# Patient Record
Sex: Female | Born: 1985 | Race: White | Hispanic: Yes | Marital: Married | Smoking: Never smoker
Health system: Southern US, Community
[De-identification: ages and names within clinical notes are randomized; demographics above are authoritative.]

## PROBLEM LIST (undated history)

## (undated) DIAGNOSIS — R7303 Prediabetes: Secondary | ICD-10-CM

## (undated) DIAGNOSIS — Z862 Personal history of diseases of the blood and blood-forming organs and certain disorders involving the immune mechanism: Secondary | ICD-10-CM

## (undated) DIAGNOSIS — J4520 Mild intermittent asthma, uncomplicated: Secondary | ICD-10-CM

## (undated) DIAGNOSIS — Z8619 Personal history of other infectious and parasitic diseases: Secondary | ICD-10-CM

## (undated) DIAGNOSIS — D251 Intramural leiomyoma of uterus: Secondary | ICD-10-CM

## (undated) DIAGNOSIS — K219 Gastro-esophageal reflux disease without esophagitis: Secondary | ICD-10-CM

## (undated) DIAGNOSIS — N945 Secondary dysmenorrhea: Secondary | ICD-10-CM

## (undated) DIAGNOSIS — J45909 Unspecified asthma, uncomplicated: Secondary | ICD-10-CM

## (undated) DIAGNOSIS — B159 Hepatitis A without hepatic coma: Secondary | ICD-10-CM

## (undated) DIAGNOSIS — D649 Anemia, unspecified: Secondary | ICD-10-CM

## (undated) HISTORY — DX: Hepatitis a without hepatic coma: B15.9

## (undated) HISTORY — DX: Gastro-esophageal reflux disease without esophagitis: K21.9

---

## 2005-06-07 HISTORY — PX: OTHER SURGICAL HISTORY: SHX169

## 2005-06-07 HISTORY — PX: SOFT TISSUE MASS EXCISION: SHX2419

## 2012-06-28 ENCOUNTER — Encounter (HOSPITAL_COMMUNITY): Payer: Self-pay | Admitting: *Deleted

## 2012-07-11 ENCOUNTER — Encounter (HOSPITAL_COMMUNITY): Payer: Self-pay

## 2012-07-11 ENCOUNTER — Other Ambulatory Visit: Payer: Self-pay | Admitting: Obstetrics and Gynecology

## 2012-07-11 ENCOUNTER — Ambulatory Visit (HOSPITAL_COMMUNITY)
Admission: RE | Admit: 2012-07-11 | Discharge: 2012-07-11 | Disposition: A | Discharge status: Home / Self Care | Payer: Self-pay | Source: Ambulatory Visit | Attending: Obstetrics and Gynecology | Admitting: Obstetrics and Gynecology

## 2012-07-11 VITALS — BP 100/76 | Temp 98.6°F | Ht 65.0 in | Wt 121.4 lb

## 2012-07-11 DIAGNOSIS — N63 Unspecified lump in unspecified breast: Secondary | ICD-10-CM

## 2012-07-11 DIAGNOSIS — N6314 Unspecified lump in the right breast, lower inner quadrant: Secondary | ICD-10-CM | POA: Insufficient documentation

## 2012-07-11 DIAGNOSIS — N6323 Unspecified lump in the left breast, lower outer quadrant: Secondary | ICD-10-CM

## 2012-07-11 DIAGNOSIS — Z1239 Encounter for other screening for malignant neoplasm of breast: Secondary | ICD-10-CM

## 2012-07-11 HISTORY — DX: Unspecified asthma, uncomplicated: J45.909

## 2012-07-11 HISTORY — DX: Anemia, unspecified: D64.9

## 2012-07-11 NOTE — Patient Instructions (Signed)
Taught patient how to perform BSE. Patient did not need a Pap smear today due to last Pap smear was July 10, 2012. Let her know BCCCP will cover Pap smears every 3 years unless has a history of abnormal Pap smears. Referred patient to the [facility] for bilateral breast ultrasound. Appointment scheduled for Monday, July 17, 2012 at 1500. Patient aware of appointment and will be there. Patient verbalized understanding.

## 2012-07-11 NOTE — Progress Notes (Signed)
Complaints of lumps in both breast and right breast pain that she rates at a 7-8 out of 10.  Pap Smear:    Pap smear not completed today. Last Pap smear was 07/10/2012 at the free cervical cancer screening at the [facility] and awaiting Pap smear results. Per patient has no history of abnormal Pap smears. No Pap smear results in EPIC.  Physical exam: Breasts Breasts symmetrical. No skin abnormalities bilateral breasts. No nipple retraction bilateral breasts. No nipple discharge bilateral breasts. No lymphadenopathy. Palpated mobile lump within the left breast at 4 o'clock 1 cm from the areola. Palpated mobile lump within the right breast 5 o'clock 1 cm from the areola. Complaints of tenderness when palpated lump in right breast. Referred patient to the [facility] for bilateral breast ultrasound. Appointment scheduled for Monday, July 17, 2012 at 1500.         Pelvic/Bimanual No Pap smear completed today since last Pap smear was 07/10/2012. Pap smear not indicated per BCCCP guidelines.

## 2012-07-17 ENCOUNTER — Ambulatory Visit
Admission: RE | Admit: 2012-07-17 | Discharge: 2012-07-17 | Disposition: A | Discharge status: Home / Self Care | Payer: No Typology Code available for payment source | Source: Ambulatory Visit | Attending: Obstetrics and Gynecology | Admitting: Obstetrics and Gynecology

## 2012-07-17 DIAGNOSIS — N63 Unspecified lump in unspecified breast: Secondary | ICD-10-CM

## 2012-07-31 ENCOUNTER — Telehealth (HOSPITAL_COMMUNITY): Payer: Self-pay | Admitting: *Deleted

## 2012-07-31 NOTE — Telephone Encounter (Signed)
Telephoned patient at home # with interpreter Delorise Royals. Advised patient of negative ultrasound results and would need screening mammogram at age 27. Patient voiced understanding.

## 2013-12-12 ENCOUNTER — Encounter: Payer: Self-pay | Admitting: Family Medicine

## 2013-12-13 ENCOUNTER — Encounter: Payer: Self-pay | Admitting: *Deleted

## 2013-12-17 ENCOUNTER — Encounter: Payer: Self-pay | Admitting: Family Medicine

## 2014-04-08 ENCOUNTER — Encounter (HOSPITAL_COMMUNITY): Payer: Self-pay

## 2014-06-07 DIAGNOSIS — Z8742 Personal history of other diseases of the female genital tract: Secondary | ICD-10-CM

## 2014-06-07 HISTORY — DX: Personal history of other diseases of the female genital tract: Z87.42

## 2015-04-30 ENCOUNTER — Ambulatory Visit (INDEPENDENT_AMBULATORY_CARE_PROVIDER_SITE_OTHER): Payer: BLUE CROSS/BLUE SHIELD | Admitting: Family Medicine

## 2015-04-30 ENCOUNTER — Ambulatory Visit (INDEPENDENT_AMBULATORY_CARE_PROVIDER_SITE_OTHER): Payer: BLUE CROSS/BLUE SHIELD

## 2015-04-30 VITALS — BP 110/76 | HR 82 | Temp 98.7°F | Resp 20 | Ht 65.0 in | Wt 128.4 lb

## 2015-04-30 DIAGNOSIS — J0100 Acute maxillary sinusitis, unspecified: Secondary | ICD-10-CM

## 2015-04-30 DIAGNOSIS — R062 Wheezing: Secondary | ICD-10-CM | POA: Diagnosis not present

## 2015-04-30 DIAGNOSIS — J208 Acute bronchitis due to other specified organisms: Secondary | ICD-10-CM | POA: Diagnosis not present

## 2015-04-30 DIAGNOSIS — R059 Cough, unspecified: Secondary | ICD-10-CM

## 2015-04-30 DIAGNOSIS — R05 Cough: Secondary | ICD-10-CM

## 2015-04-30 DIAGNOSIS — J45901 Unspecified asthma with (acute) exacerbation: Secondary | ICD-10-CM

## 2015-04-30 MED ORDER — ALBUTEROL SULFATE HFA 108 (90 BASE) MCG/ACT IN AERS: 2.0000 | INHALATION_SPRAY | Freq: Four times a day (QID) | RESPIRATORY_TRACT | Status: DC | PRN

## 2015-04-30 MED ORDER — METHYLPREDNISOLONE 4 MG PO TBPK: ORAL_TABLET | ORAL | Status: DC

## 2015-04-30 MED ORDER — IPRATROPIUM BROMIDE 0.02 % IN SOLN
0.5000 mg | Freq: Once | RESPIRATORY_TRACT | Status: AC
  Administered 2015-04-30: 0.5 mg via RESPIRATORY_TRACT

## 2015-04-30 MED ORDER — ALBUTEROL SULFATE (2.5 MG/3ML) 0.083% IN NEBU
2.5000 mg | INHALATION_SOLUTION | Freq: Once | RESPIRATORY_TRACT | Status: AC
  Administered 2015-04-30: 2.5 mg via RESPIRATORY_TRACT

## 2015-04-30 MED ORDER — HYDROCODONE-HOMATROPINE 5-1.5 MG/5ML PO SYRP: 5.0000 mL | ORAL_SOLUTION | Freq: Three times a day (TID) | ORAL | Status: DC | PRN

## 2015-04-30 MED ORDER — AZITHROMYCIN 250 MG PO TABS: ORAL_TABLET | ORAL | Status: DC

## 2015-04-30 NOTE — Progress Notes (Signed)
Chief Complaint:  Chief Complaint  Patient presents with  . Nasal Congestion    started monday   . Cough  . Fever    HPI: Amy Gaines is a 29 y.o. female who [facility] complaining of chest congestion and chest pain since Monday, last week strted having allergies and itchy eyes and then Monday she felt congestedf. She has no wheezing that she can hear durign the day may be some at night or SOB. She has pleuritic CP . She has chest pain when she breathes through her mouth. She ahs tried robitussion, theraflu, wihtout releif. She has a hx of asthma. She does not have an albuterol inhaler. She is a nonsmoker.   Past Medical History  Diagnosis Date  . Anemia   . Asthma    Past Surgical History  Procedure Laterality Date  . Lump removed from left axilla  2007   Social History   Social History  . Marital Status: Single    Spouse Name: N/A  . Number of Children: N/A  . Years of Education: N/A   Social History Main Topics  . Smoking status: Never Smoker   . Smokeless tobacco: Never Used  . Alcohol Use: No  . Drug Use: No  . Sexual Activity: Yes    Birth Control/ Protection: Condom   Other Topics Concern  . None   Social History Narrative   Family History  Problem Relation Age of Onset  . Diabetes Mother   . Heart disease Mother   . Hypertension Mother   . Cancer Maternal Grandmother     cervical  . Diabetes Maternal Grandmother   . Heart disease Maternal Grandmother    No Known Allergies Prior to Admission medications   Not on File     ROS: The patient denies fevers, chills, night sweats, unintentional weight loss,  palpitations,  dyspnea on exertion, nausea, vomiting, abdominal pain, dysuria, hematuria, melena, numbness, weakness, or tingling.   All other systems have been reviewed and were otherwise negative with the exception of those mentioned in the HPI and as above.    PHYSICAL EXAM: Filed Vitals:   04/30/15 1359  BP: 110/76   Pulse: 82  Temp: 98.7 F (37.1 C)  Resp: 20   Body mass index is 21.37 kg/(m^2).   General: Alert, no acute distress HEENT:  Normocephalic, atraumatic, oropharynx patent. EOMI, PERRLA Erythematous throat, no exudates, TM normal, + sinus tenderness, + erythematous/boggy nasal mucosa Cardiovascular:  Regular rate and rhythm, no rubs murmurs or gallops.  No Carotid bruits, radial pulse intact. No pedal edema.  Respiratory: Clear to auscultation bilaterally.  + wheezes, No rales, or rhonchi.  No cyanosis, no use of accessory musculature Abdominal: No organomegaly, abdomen is soft and non-tender, positive bowel sounds. No masses. Skin: No rashes. Neurologic: Facial musculature symmetric. Psychiatric: Patient acts appropriately throughout our interaction. Lymphatic: No cervical or submandibular lymphadenopathy Musculoskeletal: Gait intact. No edema, tenderness   LABS: No results found for this or any previous visit.   EKG/XRAY:   [facility]. No acute cardiopulm process   ASSESSMENT/PLAN: Encounter Diagnoses  Name Primary?  . Wheezing   . Cough   . Asthma with exacerbation, unspecified asthma severity Yes  . Acute maxillary sinusitis, recurrence not specified   . Acute bronchitis due to other specified organisms    Neb x 1 Felt improved, better air movement Rx zpack, hycodan, prednisone taper, albuterol prn  Fu  prn   Gross sideeffects, risk and benefits, and alternatives of medications d/w patient. Patient is aware that all medications have potential sideeffects and we are unable to predict every sideeffect or drug-drug interaction that may occur.  Soliyana Mcchristian DO  04/30/2015 2:49 PM

## 2015-04-30 NOTE — Patient Instructions (Signed)
Bronquitis aguda (Acute Bronchitis) La bronquitis es una inflamacin de las vas respiratorias que se extienden desde la trquea [facility] pulmones (bronquios). La inflamacin produce la formacin de mucosidad. Esto produce tos, que es el sntoma ms frecuente de la bronquitis.  Cuando la bronquitis es [place], generalmente comienza de [place] sbita y desaparece luego de un par de semanas. El hbito de fumar, las alergias y el asma pueden empeorar la bronquitis. Los episodios repetidos de bronquitis pueden causar ms problemas pulmonares.  CAUSAS La causa ms frecuente de bronquitis aguda es el mismo virus que produce el resfro. El virus puede propagarse de Ardelia Mems persona a la otra (contagioso) a travs de la tos y los estornudos, y al tocar objetos contaminados. [place].  Cristy Hilts.  Tos con mucosidad.  Dolores [employer] cuerpo.  Congestin en el pecho.  Escalofros.  Falta de aire.  Dolor de Investment banker, operational. DIAGNSTICO  La bronquitis aguda en general se diagnostica con un examen fsico. El mdico tambin Deondray Ospina har preguntas sobre su historia clnica. En algunos casos se indican otros estudios, como radiografas, para Clinical research associate.  TRATAMIENTO  La bronquitis aguda generalmente desaparece en un par de semanas. Con frecuencia, no es Systems analyst. Los medicamentos se indican para aliviar la fiebre o la tos. Generalmente, no es necesario el uso de antibiticos, pero pueden recetarse en ciertas ocasiones. En algunos casos, se recomienda el uso de un inhalador para mejorar la falta de aire y Aeronautical engineer tos. Un vaporizador de aire fro podr ayudarlo a [employer] bronquiales y Armed forces technical officer su eliminacin.  INSTRUCCIONES PARA EL CUIDADO EN EL HOGAR  Descanse lo suficiente.  Beba lquidos en abundancia para mantener la orina de color claro o amarillo plido (excepto que padezca una enfermedad que requiera la restriccin de lquidos). El aumento  de lquidos puede ayudar a que las secreciones respiratorias (esputo) sean menos espesas y a reducir la congestin del pecho, y Mining engineer deshidratacin.  Tome los medicamentos solamente como se lo haya indicado el mdico.  Si Jennavecia Schwier recetaron antibiticos, asegrese de terminarlos, incluso si comienza a sentirse mejor.  Evite fumar o aspirar el humo de otros fumadores. La exposicin al humo del cigarrillo o a irritantes qumicos har que la bronquitis empeore. Si fuma, considere el uso de goma de Higher education careers adviser o la aplicacin de parches en la piel que contengan nicotina para Public house manager los sntomas de abstinencia. Si deja de fumar, sus pulmones se curarn ms rpido.  Reduzca la probabilidad de otro episodio de bronquitis aguda lavando sus manos con frecuencia, evitando a las personas que tengan sntomas y tratando de no tocarse las manos con la boca, la nariz o los ojos.  Concurra a todas las visitas de control como se lo haya indicado el mdico. SOLICITE ATENCIN MDICA SI: Los sntomas no mejoran despus de una semana de [place].  SOLICITE ATENCIN MDICA DE INMEDIATO SI:  Comienza a tener fiebre o escalofros cada vez ms intensos.  Siente dolor en el pecho.  Faithe Ariola falta el aire de manera preocupante.  La flema tiene [place].  Se deshidrata.  Se desmaya o siente que va a desmayarse de forma repetida.  Tiene vmitos que se repiten.  Tiene un dolor de cabeza intenso. ASEGRESE DE QUE:   Comprende estas instrucciones.  Controlar su afeccin.  Recibir ayuda de inmediato si no mejora o si empeora.   Esta informacin no tiene Marine scientist el consejo del mdico. Asegrese de hacerle al mdico cualquier  pregunta que tenga.   Document Released: 05/24/2005 Document Revised: 06/14/2014 Elsevier Interactive Patient Education 2016 [place], broncoespasmo agudo (Asthma, Acute Bronchospasm) El broncoespasmo agudo causado por el asma tambin se conoce como crisis de asma.  Broncoespasmo significa que las vas respiratorias se han estrechado. La causa del estrechamiento es la inflamacin y la constriccin de los msculos de las vas respiratorias (bronquios) que se encuentran en los pulmones. Esto puede dificultar la respiracin o provocarle sibilancias y tos. [place] desencadenantes posibles son:  La caspa que eliminan los animales de la piel, el pelo o las plumas de [place].  Los caros que se encuentran en el polvo de la casa.  [place].  El polen de los rboles o el csped.  [place].  El humo del cigarrillo o del tabaco  Sustancias contaminantes como el polvo, limpiadores hogareos, aerosoles (como los [place] para el cabello), vapores de pintura, sustancias qumicas fuertes u olores intensos.  El aire fro o cambios climticos. El aire fro puede causar inflamacin. El viento aumenta la cantidad de moho y polen del aire.  Emociones fuertes, Engineer, production o rer intensamente.  Estrs.  Ciertos medicamentos como la aspirina o betabloqueantes.  Los sulfitos que se encuentran en las comidas y bebidas como frutas secas y el vino.  Enfermedades infecciosas o inflamatorias, como la gripe, el resfro o la inflamacin de las membranas nasales (rinitis).  El reflujo gastroesofgico (ERGE). El reflujo gastroesofgico es una afeccin en la que los cidos estomacales vuelven al esfago.  Los ejercicios o actividades extenuantes. [place].  Tos intensa, especialmente por la noche.  Opresin en el pecho.  Falta de aire. DIAGNSTICO  El mdico Alicya Bena har una historia clnica y Laporscha Linehan har un examen fsico. [place] Comment indicarn radiografas o anlisis de sangre para buscar otras causas de los sntomas u otras enfermedades que puedan desencadenar una crisis de asma.  TRATAMIENTO  El tratamiento est dirigido a reducir la inflamacin y [place] vas respiratorias en los pulmones. La mayor parte de las crisis asmticas se tratan con  medicamentos por va inhalatoria. Entre ellos se incluyen los medicamentos de alivio rpido o medicamentos de rescate (como los broncodilatadores) y los medicamentos de control (como los corticoides inhalados). Estos medicamentos se administran a travs de Educational psychologist o de un nebulizador. Los corticoides sistmicos por va oral o por va intravenosa tambin se administran para reducir la inflamacin cuando un ataque es moderado o grave. Los antibiticos se indican solo si hay infeccin bacteriana.  INSTRUCCIONES PARA EL CUIDADO EN EL HOGAR   Reposo.  Beba lquido en abundancia. Esto ayuda a diluir la mucosidad y a Transport planner. Solo consuma productos con cafena moderadamente y no consuma alcohol hasta que se haya recuperado de la enfermedad.  No fume. Evite la exposicin al humo de otros fumadores.  Usted tiene un rol fundamental en mantener su buena salud. Evite la exposicin a lo que [facility] respiratorios.  Mantenga los medicamentos actualizados y al alcance. Siga cuidadosamente el plan de tratamiento del mdico.  Utilice los medicamentos tal como se Aundrea Higginbotham indic.  Cuando haya mucho polen o polucin, mantenga las ventanas cerradas y use el aire acondicionado o vaya a lugares con aire acondicionado.  El asma requiere atencin [place]. Concurra a los controles segn las indicaciones. Si tiene Nutritional therapist de ms de 24 semanas y Ajdin Macke han recetado medicamentos nuevos, comntelo con su obstetra y cul es su evolucin. Concurra a las consultas de control  con su mdico segn las indicaciones.  Despus de recuperarse de la crisis de asma, haga una cita con el mdico para conocer cmo puede reducir la probabilidad de futuros ataques. Si no cuenta con un mdico para que controle su asma, haga una cita con un mdico de atencin primaria para hablar de esta enfermedad. [place] DE INMEDIATO SI:   Empeora.  Tiene dificultad para respirar. Si la dificultad  es intensa comunquese con el servicio de Multimedia programmer de su localidad (911 en los [place]).  Siente dolor o Adult nurse.  Tiene vmitos.  No puede retener los lquidos.  Elimina una expectoracin verde, amarilla, amarronada o sanguinolenta.  Tiene fiebre y los sntomas empeoran repentinamente.  Presenta dificultad para tragar. ASEGRESE DE QUE:   Comprende estas instrucciones.  Controlar su afeccin.  Recibir ayuda de inmediato si no mejora o si empeora.   Esta informacin no tiene Marine scientist el consejo del mdico. Asegrese de hacerle al mdico cualquier pregunta que tenga.   Document Released: 09/09/2008 Document Revised: 05/29/2013 Elsevier Interactive Patient Education [employer].

## 2016-01-10 ENCOUNTER — Ambulatory Visit (INDEPENDENT_AMBULATORY_CARE_PROVIDER_SITE_OTHER): Payer: BLUE CROSS/BLUE SHIELD | Admitting: Family Medicine

## 2016-01-10 VITALS — BP 121/70 | HR 97 | Temp 97.9°F | Resp 12 | Ht 66.0 in | Wt 137.0 lb

## 2016-01-10 DIAGNOSIS — L237 Allergic contact dermatitis due to plants, except food: Secondary | ICD-10-CM

## 2016-01-10 MED ORDER — PREDNISONE 20 MG PO TABS: ORAL_TABLET | ORAL | 0 refills | Status: DC

## 2016-01-10 NOTE — Progress Notes (Signed)
By signing my name below I, Shelah Lewandowsky, attest that this documentation has been prepared under the direction and in the presence of Shade Flood, MD. Electonically Signed. Shelah Lewandowsky, Scribe 01/10/2016 at 11:47 AM  Subjective:    Patient ID: Amy Gaines, female    DOB: 01-12-1986, 31 y.o.   MRN: [id number]  Chief Complaint  Patient presents with   Rash    both arms and legs 1 week.    HPI Amy Gaines is a 30 y.o. female who presents to the [facility] complaining of pruritic rash on all 4 extremities and abd for the past week. Rash started on extremities and has spread to abd and left ear. Rash started after pt was cleaning her backyard. Pt has been putting hydrocortisone cream on it and taking allegra with mild relief. Pt denies rash on face or genital area.  Pt denies any SOB, CP, sore throat, or difficultly swallowing.  Pt has used prednisone in the past with no complications.  Pt denies chance of pregnancy.    Patient Active Problem List   Diagnosis Date Noted   Breast lump on left side at 4 o'clock position 07/11/2012   Breast lump on right side at 5 o'clock position 07/11/2012   Past Medical History:  Diagnosis Date   Anemia    Asthma    Past Surgical History:  Procedure Laterality Date   lump removed from left axilla  2007   No Known Allergies Prior to Admission medications   Medication Sig Start Date End Date Taking? Authorizing Provider  albuterol (PROVENTIL HFA;VENTOLIN HFA) 108 (90 BASE) MCG/ACT inhaler Inhale 2 puffs into the lungs every 6 (six) hours as needed for wheezing or shortness of breath. 04/30/15  Yes Thao P Le, DO  HYDROcodone-homatropine (HYCODAN) 5-1.5 MG/5ML syrup Take 5 mLs by mouth every 8 (eight) hours as needed for cough. 04/30/15  Yes Thao Dayna Ramus, DO   Social History   Social History   Marital status: Single    Spouse name: N/A   Number of children: N/A   Years of education: N/A    Occupational History   Not on file.   Social History Main Topics   Smoking status: Never Smoker   Smokeless tobacco: Never Used   Alcohol use No   Drug use: No   Sexual activity: Yes    Birth control/ protection: Condom   Other Topics Concern   Not on file   Social History Narrative   No narrative on file      Review of Systems  Constitutional: Negative for fever.  HENT: Negative for sore throat and trouble swallowing.   Respiratory: Negative for shortness of breath.   Cardiovascular: Negative for chest pain.  Skin: Positive for rash (on all 4 extremities, abd, and left ear).       Objective:   Physical Exam  Constitutional: She is oriented to person, place, and time. She appears well-developed and well-nourished. No distress.  HENT:  Head: Normocephalic and atraumatic.  Eyes: Conjunctivae are normal. Pupils are equal, round, and reactive to light.  Neck: Neck supple.  Cardiovascular: Normal rate.   Pulmonary/Chest: Effort normal.  Musculoskeletal: Normal range of motion.  Neurological: She is alert and oriented to person, place, and time.  Skin: Skin is warm and dry.  Pt has diffuse erythematous patches with overlying papules on forearms bilat, lower legs bilat, and abd. Some patches are linear in appearance. Pt also has erythematous area  on the outside of her left ear. Pt also has small erythematous papules on rt chin.  Psychiatric: She has a normal mood and affect. Her behavior is normal.  Nursing note and vitals reviewed.    Vitals:   01/10/16 1109  BP: 121/70  Pulse: 97  Resp: 12  Temp: 97.9 F (36.6 C)  TempSrc: Oral  SpO2: 100%  Weight: 137 lb (62.1 kg)  Height: 5\' 6"  (1.676 m)         Assessment & Plan:    Amy Gaines is a 30 y.o. female Contact dermatitis due to poison ivy - Plan: predniSONE (DELTASONE) 20 MG tablet  Diffuse contact derm from likely poison ivy, now affecting left ear, and with other degree of spread  decided on prednisone taper. Side effects discussed, RTC precautions discussed and handout given.  Meds ordered this encounter  Medications   predniSONE (DELTASONE) 20 MG tablet    Sig: 3 by mouth for 3 days, then 2 by mouth for 2 days, then 1 by mouth for 2 days, then 1/2 by mouth for 2 days.    Dispense:  16 tablet    Refill:  0   Patient Instructions   Your rash does appear to be poison ivy. Start prednisone as discussed due to spread of rash. Continue Allegra once per day.  Return to the clinic or go to the nearest emergency room if any of your symptoms worsen or new symptoms occur.   Poison [employer] ivy is a inflammation of the skin (contact dermatitis) caused by touching the allergens on the leaves of the ivy plant following previous exposure to the plant. The rash usually appears 48 hours after exposure. The rash is usually bumps (papules) or blisters (vesicles) in a linear pattern. Depending on your own sensitivity, the rash may simply cause redness and itching, or it may also progress to blisters which may break open. These must be well cared for to prevent secondary bacterial (germ) infection, followed by scarring. Keep any open areas dry, clean, dressed, and covered with an antibacterial ointment if needed. The eyes may also get puffy. The puffiness is worst in the morning and gets better as the day progresses. This dermatitis usually heals without scarring, within 2 to 3 weeks without treatment. HOME CARE INSTRUCTIONS  Thoroughly wash with soap and water as soon as you have been exposed to poison ivy. You have about one half hour to remove the plant resin before it will cause the rash. This washing will destroy the oil or antigen on the skin that is causing, or will cause, the rash. Be sure to wash under your fingernails as any plant resin there will continue to spread the rash. Do not rub skin vigorously when washing affected area. Poison ivy cannot spread if no oil from the plant  remains on your body. A rash that has progressed to weeping sores will not spread the rash unless you have not washed thoroughly. It is also important to wash any clothes you have been wearing as these may carry active allergens. The rash will return if you wear the unwashed clothing, even several days later. Avoidance of the plant in the future is the best measure. Poison ivy plant can be recognized by the number of leaves. Generally, poison ivy has three leaves with flowering branches on a single stem. Diphenhydramine may be purchased over the counter and used as needed for itching. Do not drive with this medication if it makes you drowsy.Ask your  caregiver about medication for children. SEEK MEDICAL CARE IF:  Open sores develop.  Redness spreads beyond area of rash.  You notice purulent (pus-like) discharge.  You have increased pain.  Other signs of infection develop (such as fever).   This information is not intended to replace advice given to you by your health care provider. Make sure you discuss any questions you have with your health care provider.   Document Released: 05/21/2000 Document Revised: 08/16/2011 Document Reviewed: 10/30/2014 Elsevier Interactive Patient Education 2016 [employer].    IF you received an x-ray today, you will receive an invoice from [facility]. Please contact [facility] at [phone] with questions or concerns regarding your invoice.   IF you received labwork today, you will receive an invoice from [employer]. Please contact [facility] at [phone] with questions or concerns regarding your invoice.   Our billing staff will not be able to assist you with questions regarding bills from these companies.  You will be contacted with the lab results as soon as they are available. The fastest way to get your results is to activate your My Chart account. Instructions are located on the last page of this  paperwork. If you have not heard from [place] regarding the results in 2 weeks, please contact this office.        I personally performed the services described in this documentation, which was scribed in my presence. The recorded information has been reviewed and considered, and addended by me as needed.   Signed,   Meredith Staggers, MD [facility].  01/12/16 1:33 PM

## 2016-01-10 NOTE — Patient Instructions (Addendum)
Your rash does appear to be poison ivy. Start prednisone as discussed due to spread of rash. Continue Allegra once per day.  Return to the clinic or go to the nearest emergency room if any of your symptoms worsen or new symptoms occur.   Poison [employer] ivy is a inflammation of the skin (contact dermatitis) caused by touching the allergens on the leaves of the ivy plant following previous exposure to the plant. The rash usually appears 48 hours after exposure. The rash is usually bumps (papules) or blisters (vesicles) in a linear pattern. Depending on your own sensitivity, the rash may simply cause redness and itching, or it may also progress to blisters which may break open. These must be well cared for to prevent secondary bacterial (germ) infection, followed by scarring. Keep any open areas dry, clean, dressed, and covered with an antibacterial ointment if needed. The eyes may also get puffy. The puffiness is worst in the morning and gets better as the day progresses. This dermatitis usually heals without scarring, within 2 to 3 weeks without treatment. HOME CARE INSTRUCTIONS  Thoroughly wash with soap and water as soon as you have been exposed to poison ivy. You have about one half hour to remove the plant resin before it will cause the rash. This washing will destroy the oil or antigen on the skin that is causing, or will cause, the rash. Be sure to wash under your fingernails as any plant resin there will continue to spread the rash. Do not rub skin vigorously when washing affected area. Poison ivy cannot spread if no oil from the plant remains on your body. A rash that has progressed to weeping sores will not spread the rash unless you have not washed thoroughly. It is also important to wash any clothes you have been wearing as these may carry active allergens. The rash will return if you wear the unwashed clothing, even several days later. Avoidance of the plant in the future is the best measure.  Poison ivy plant can be recognized by the number of leaves. Generally, poison ivy has three leaves with flowering branches on a single stem. Diphenhydramine may be purchased over the counter and used as needed for itching. Do not drive with this medication if it makes you drowsy.Ask your caregiver about medication for children. SEEK MEDICAL CARE IF:  Open sores develop.  Redness spreads beyond area of rash.  You notice purulent (pus-like) discharge.  You have increased pain.  Other signs of infection develop (such as fever).   This information is not intended to replace advice given to you by your health care provider. Make sure you discuss any questions you have with your health care provider.   Document Released: 05/21/2000 Document Revised: 08/16/2011 Document Reviewed: 10/30/2014 Elsevier Interactive Patient Education 2016 [employer].    IF you received an x-ray today, you will receive an invoice from [facility]. Please contact [facility] at [phone] with questions or concerns regarding your invoice.   IF you received labwork today, you will receive an invoice from [employer]. Please contact [facility] at [phone] with questions or concerns regarding your invoice.   Our billing staff will not be able to assist you with questions regarding bills from these companies.  You will be contacted with the lab results as soon as they are available. The fastest way to get your results is to activate your My Chart account. Instructions are located on the last page of this paperwork. If  you have not heard from [place] regarding the results in 2 weeks, please contact this office.

## 2016-07-26 ENCOUNTER — Encounter: Payer: Self-pay | Admitting: Physician Assistant

## 2016-07-26 ENCOUNTER — Ambulatory Visit (INDEPENDENT_AMBULATORY_CARE_PROVIDER_SITE_OTHER): Payer: BLUE CROSS/BLUE SHIELD | Admitting: Physician Assistant

## 2016-07-26 VITALS — BP 118/67 | HR 75 | Temp 98.9°F | Resp 16 | Ht 66.0 in | Wt 145.0 lb

## 2016-07-26 DIAGNOSIS — Z114 Encounter for screening for human immunodeficiency virus [HIV]: Secondary | ICD-10-CM

## 2016-07-26 DIAGNOSIS — Z23 Encounter for immunization: Secondary | ICD-10-CM | POA: Diagnosis not present

## 2016-07-26 DIAGNOSIS — Z0000 Encounter for general adult medical examination without abnormal findings: Secondary | ICD-10-CM | POA: Diagnosis not present

## 2016-07-26 DIAGNOSIS — J4520 Mild intermittent asthma, uncomplicated: Secondary | ICD-10-CM | POA: Diagnosis not present

## 2016-07-26 DIAGNOSIS — F341 Dysthymic disorder: Secondary | ICD-10-CM | POA: Diagnosis not present

## 2016-07-26 DIAGNOSIS — Z130 Encounter for screening for diseases of the blood and blood-forming organs and certain disorders involving the immune mechanism: Secondary | ICD-10-CM | POA: Diagnosis not present

## 2016-07-26 DIAGNOSIS — Z1389 Encounter for screening for other disorder: Secondary | ICD-10-CM | POA: Diagnosis not present

## 2016-07-26 DIAGNOSIS — Z13220 Encounter for screening for lipoid disorders: Secondary | ICD-10-CM | POA: Diagnosis not present

## 2016-07-26 DIAGNOSIS — Z1329 Encounter for screening for other suspected endocrine disorder: Secondary | ICD-10-CM

## 2016-07-26 DIAGNOSIS — N926 Irregular menstruation, unspecified: Secondary | ICD-10-CM | POA: Diagnosis not present

## 2016-07-26 DIAGNOSIS — Z124 Encounter for screening for malignant neoplasm of cervix: Secondary | ICD-10-CM | POA: Diagnosis not present

## 2016-07-26 DIAGNOSIS — Z01419 Encounter for gynecological examination (general) (routine) without abnormal findings: Secondary | ICD-10-CM | POA: Diagnosis not present

## 2016-07-26 DIAGNOSIS — Z131 Encounter for screening for diabetes mellitus: Secondary | ICD-10-CM

## 2016-07-26 LAB — HM PAP SMEAR: HM Pap smear: NEGATIVE

## 2016-07-26 MED ORDER — ALBUTEROL SULFATE HFA 108 (90 BASE) MCG/ACT IN AERS: 2.0000 | INHALATION_SPRAY | Freq: Four times a day (QID) | RESPIRATORY_TRACT | 3 refills | Status: DC | PRN

## 2016-07-26 MED ORDER — SERTRALINE HCL 50 MG PO TABS: 25.0000 mg | ORAL_TABLET | Freq: Every day | ORAL | 3 refills | Status: DC

## 2016-07-26 NOTE — Progress Notes (Signed)
07/26/2016 3:19 PM   DOB: [birth year] / MRN: [id number]  SUBJECTIVE:  Amy Gaines is a 31 y.o. female presenting for annual exam. Get breast exams and paps at her GYN office and planes to continue that.   Has a history of asthma. No night time awakenings due to cough. Uses inhaler twice monthly.  Did refill her inhaler three times last year.   She tells me that she is always tired and she does not sleep well. Goes to be around 9pm and will often wake up at 1 and can not go back to sleep. Tells me that she is sad and will often cry for no reason and feels that something is missing from her life.  Thinks this is due to not having her son who is in [place] with the rest of her family aside from her sisters. Feels that when she is out often wants to go home and has a difficult time having fun.   Depression screen PHQ 2/9 07/26/2016  Decreased Interest 0  Down, Depressed, Hopeless 0  PHQ - 2 Score 0   Immunization History  Administered Date(s) Administered  . Influenza,inj,Quad PF,36+ Mos 07/26/2016    She has No Known Allergies.   She  has a past medical history of Anemia and Asthma.    She  reports that she has never smoked. She has never used smokeless tobacco. She reports that she does not drink alcohol or use drugs. She  reports that she currently engages in sexual activity. She reports using the following method of birth control/protection: Condom. The patient  has a past surgical history that includes lump removed from left axilla (2007).  Her family history includes Cancer in her maternal grandmother; Diabetes in her maternal grandmother and mother; Heart disease in her maternal grandmother and mother; Hypertension in her mother.  Review of Systems  Constitutional: Negative for chills and fever.  Respiratory: Negative for cough and shortness of breath.   Cardiovascular: Negative for chest pain and leg swelling.  Gastrointestinal: Negative for abdominal pain and nausea.   Neurological: Negative for dizziness.    The problem list and medications were reviewed and updated by myself where necessary and exist elsewhere in the encounter.   OBJECTIVE:  BP 118/67   Pulse 75   Temp 98.9 F (37.2 C) (Oral)   Resp 16   Ht 5' 6"  (1.676 m)   Wt 145 lb (65.8 kg)   LMP 06/09/2016   SpO2 100%   BMI 23.40 kg/m   Physical Exam  Constitutional: She is oriented to person, place, and time.  Cardiovascular: Normal rate, regular rhythm and normal heart sounds.   Pulmonary/Chest: Effort normal and breath sounds normal.  Abdominal: Soft. Bowel sounds are normal.  Musculoskeletal: Normal range of motion.  Neurological: She is alert and oriented to person, place, and time. She displays normal reflexes. No cranial nerve deficit. She exhibits normal muscle tone. Coordination normal.  Psychiatric: Thought content normal. Her mood appears not anxious. Her affect is not angry, not blunt, not labile and not inappropriate. Her speech is not rapid and/or pressured and not delayed. She is withdrawn. She is not agitated, not slowed and not actively hallucinating. Cognition and memory are not impaired. She does not express impulsivity or inappropriate judgment. She exhibits a depressed mood. She expresses no suicidal plans and no homicidal plans. She is attentive.    No results found for this or any previous visit (from the past 72 hour(s)).  No results  found.  ASSESSMENT AND PLAN:  Kavya was seen today for annual exam, medication refill and immunizations.  Diagnoses and all orders for this visit:  Annual physical exam: Healthy female exam. Sees GYN for reproductive care and plans to continue that. Given the last problem will start an SSRI along with psychology.  I have encouraged her to get some exercise as well.  RTC in 2-3 months.  -     Care order/instruction:  Screening for deficiency anemia -     CBC  Screening for nephropathy -     CMP14+EGFR  Screening for lipid  disorders -     TSH  Screening for thyroid disorder -     Hemoglobin A1c  Screening for diabetes mellitus -     Lipid panel  Screening for HIV (human immunodeficiency virus) -     HIV antibody  Flu vaccine need -     Flu Vaccine QUAD 36+ mos IM  Need for Tdap vaccination -     Tdap vaccine greater than or equal to 7yo IM  Mild intermittent asthma without complication -     albuterol (PROVENTIL HFA;VENTOLIN HFA) 108 (90 Base) MCG/ACT inhaler; Inhale 2 puffs into the lungs every 6 (six) hours as needed for wheezing or shortness of breath.  Dysthymia -     sertraline (ZOLOFT) 50 MG tablet; Take 0.5-1 tablets (25-50 mg total) by mouth daily. Take 1/2 tab for the first week, then start the full tab. -     Ambulatory referral to Psychology    The patient is advised to call or return to clinic if she does not see an improvement in symptoms, or to seek the care of the closest emergency department if she worsens with the above plan.   Philis Fendt, MHS, PA-C [facility] 07/26/2016 3:19 PM

## 2016-07-26 NOTE — Patient Instructions (Signed)
     IF you received an x-ray today, you will receive an invoice from [facility]. Please contact [facility] at [phone] with questions or concerns regarding your invoice.   IF you received labwork today, you will receive an invoice from [facility]. Please contact [facility] at [phone] with questions or concerns regarding your invoice.   Our billing staff will not be able to assist you with questions regarding bills from these companies.  You will be contacted with the lab results as soon as they are available. The fastest way to get your results is to activate your My Chart account. Instructions are located on the last page of this paperwork. If you have not heard from us regarding the results in 2 weeks, please contact this office.     

## 2016-07-27 LAB — CBC
HEMATOCRIT: 40.1 % (ref 34.0–46.6)
Hemoglobin: 13.4 g/dL (ref 11.1–15.9)
MCH: 29.2 pg (ref 26.6–33.0)
MCHC: 33.4 g/dL (ref 31.5–35.7)
MCV: 87 fL (ref 79–97)
Platelets: 288 10*3/uL (ref 150–379)
RBC: 4.59 x10E6/uL (ref 3.77–5.28)
RDW: 13.6 % (ref 12.3–15.4)
WBC: 7.9 10*3/uL (ref 3.4–10.8)

## 2016-07-27 LAB — CMP14+EGFR
A/G RATIO: 1.8 (ref 1.2–2.2)
ALT: 17 IU/L (ref 0–32)
AST: 18 IU/L (ref 0–40)
Albumin: 4.7 g/dL (ref 3.5–5.5)
Alkaline Phosphatase: 58 IU/L (ref 39–117)
BUN/Creatinine Ratio: 19 (ref 9–23)
BUN: 12 mg/dL (ref 6–20)
Bilirubin Total: <0.2 mg/dL (ref 0.0–1.2)
CALCIUM: 9.4 mg/dL (ref 8.7–10.2)
CO2: 23 mmol/L (ref 18–29)
CREATININE: 0.62 mg/dL (ref 0.57–1.00)
Chloride: 100 mmol/L (ref 96–106)
GFR, EST AFRICAN AMERICAN: 138 mL/min/{1.73_m2} (ref >59)
GFR, EST NON AFRICAN AMERICAN: 120 mL/min/{1.73_m2} (ref >59)
Globulin, Total: 2.6 g/dL (ref 1.5–4.5)
Glucose: 100 mg/dL — ABNORMAL HIGH (ref 65–99)
POTASSIUM: 4.2 mmol/L (ref 3.5–5.2)
Sodium: 142 mmol/L (ref 134–144)
TOTAL PROTEIN: 7.3 g/dL (ref 6.0–8.5)

## 2016-07-27 LAB — LIPID PANEL
CHOLESTEROL TOTAL: 175 mg/dL (ref 100–199)
Chol/HDL Ratio: 3.5 ratio units (ref 0.0–4.4)
HDL: 50 mg/dL (ref >39)
LDL Calculated: 88 mg/dL (ref 0–99)
TRIGLYCERIDES: 186 mg/dL — AB (ref 0–149)
VLDL CHOLESTEROL CAL: 37 mg/dL (ref 5–40)

## 2016-07-27 LAB — HEMOGLOBIN A1C
Est. average glucose Bld gHb Est-mCnc: 108 mg/dL
Hgb A1c MFr Bld: 5.4 % (ref 4.8–5.6)

## 2016-07-27 LAB — HIV ANTIBODY (ROUTINE TESTING W REFLEX): HIV Screen 4th Generation wRfx: NONREACTIVE

## 2016-07-27 LAB — TSH: TSH: 2.57 u[IU]/mL (ref 0.450–4.500)

## 2016-09-20 ENCOUNTER — Encounter: Payer: Self-pay | Admitting: Physician Assistant

## 2016-09-20 ENCOUNTER — Ambulatory Visit (INDEPENDENT_AMBULATORY_CARE_PROVIDER_SITE_OTHER): Payer: BLUE CROSS/BLUE SHIELD | Admitting: Physician Assistant

## 2016-09-20 DIAGNOSIS — F341 Dysthymic disorder: Secondary | ICD-10-CM | POA: Diagnosis not present

## 2016-09-20 MED ORDER — SERTRALINE HCL 50 MG PO TABS
50.0000 mg | ORAL_TABLET | Freq: Every day | ORAL | 3 refills | Status: DC
Start: 2016-09-20 — End: 2017-10-20

## 2016-09-20 NOTE — Patient Instructions (Signed)
     IF you received an x-ray today, you will receive an invoice from [facility]. Please contact [facility] at [phone] with questions or concerns regarding your invoice.   IF you received labwork today, you will receive an invoice from [facility]. Please contact [facility] at [phone] with questions or concerns regarding your invoice.   Our billing staff will not be able to assist you with questions regarding bills from these companies.  You will be contacted with the lab results as soon as they are available. The fastest way to get your results is to activate your My Chart account. Instructions are located on the last page of this paperwork. If you have not heard from us regarding the results in 2 weeks, please contact this office.     

## 2016-09-20 NOTE — Progress Notes (Signed)
  09/20/2016 5:21 PM   DOB: 07-04-1985 / MRN: [id number]  SUBJECTIVE:  Amy Gaines is a 31 y.o. female presenting for recheck of depression and sertraline.  Tells me that overall she feels much happier, feels more motivated, and tells me she feels she has more purpose.  She tells me her sleep has improved as well. She would like to continue therapy at this time.   Depression screen [facility] 2/9 09/20/2016 07/26/2016 07/26/2016  Decreased Interest 0 0 0  Down, Depressed, Hopeless 0 0 0  PHQ - 2 Score 0 0 0     She has No Known Allergies.   She  has a past medical history of Anemia and Asthma.    She  reports that she has never smoked. She has never used smokeless tobacco. She reports that she does not drink alcohol or use drugs. She  reports that she currently engages in sexual activity. She reports using the following method of birth control/protection: Condom. The patient  has a past surgical history that includes lump removed from left axilla (2007).  Her family history includes Cancer in her maternal grandmother; Diabetes in her maternal grandmother and mother; Heart disease in her maternal grandmother and mother; Hypertension in her mother.  Review of Systems  Constitutional: Negative for chills, diaphoresis and fever.  Eyes: Negative.   Gastrointestinal: Negative for nausea.  Skin: Negative for rash.  Neurological: Negative for dizziness, sensory change, speech change, focal weakness and headaches.    The problem list and medications were reviewed and updated by myself where necessary and exist elsewhere in the encounter.   OBJECTIVE:  BP 117/70   Pulse 78   Temp 97.1 F (36.2 C) (Oral)   Resp 16   Ht  (1.676 m)   Wt 140 lb (63.5 kg)   LMP 09/10/2016   SpO2 100%   BMI 22.60 kg/m   Physical Exam  Constitutional: She is oriented to person, place, and time. She is active.  Non-toxic appearance.  Eyes: EOM are normal. Pupils are equal, round, and reactive to light.   Cardiovascular: Normal rate.   Pulmonary/Chest: Effort normal. No tachypnea.  Neurological: She is alert and oriented to person, place, and time. She has normal strength and normal reflexes. She is not disoriented. She displays no atrophy. No cranial nerve deficit or sensory deficit. She exhibits normal muscle tone. Coordination and gait normal.  Skin: Skin is warm and dry. She is not diaphoretic. No pallor.  Psychiatric: She has a normal mood and affect. Her behavior is normal. Judgment and thought content normal.    No results found for this or any previous visit (from the past 72 hour(s)).  No results found.  ASSESSMENT AND PLAN:  Amy Gaines was seen today for follow-up.  Diagnoses and all orders for this visit:  Dysthymia: Improving.  Will continue therapy for about a year.      The patient is advised to call or return to clinic if she does not see an improvement in symptoms, or to seek the care of the closest emergency department if she worsens with the above plan.   Deliah Boston, MHS, PA-C [facility] 09/20/2016 5:21 PM

## 2016-10-05 ENCOUNTER — Other Ambulatory Visit: Payer: Self-pay

## 2016-10-05 DIAGNOSIS — J4520 Mild intermittent asthma, uncomplicated: Secondary | ICD-10-CM

## 2016-10-05 MED ORDER — ALBUTEROL SULFATE HFA 108 (90 BASE) MCG/ACT IN AERS: 2.0000 | INHALATION_SPRAY | Freq: Four times a day (QID) | RESPIRATORY_TRACT | 1 refills | Status: DC | PRN

## 2016-10-05 MED ORDER — ALBUTEROL SULFATE HFA 108 (90 BASE) MCG/ACT IN AERS: 2.0000 | INHALATION_SPRAY | Freq: Four times a day (QID) | RESPIRATORY_TRACT | 0 refills | Status: DC | PRN

## 2016-10-05 NOTE — Telephone Encounter (Signed)
proventil not covered, preferred ventolin or proair   Changed  l/m

## 2016-10-05 NOTE — Telephone Encounter (Signed)
07/2016 last visit

## 2016-10-05 NOTE — Addendum Note (Signed)
Addended by: Clarene Critchley on: 10/05/2016 03:25 PM   Modules accepted: Orders

## 2017-02-01 DIAGNOSIS — M5417 Radiculopathy, lumbosacral region: Secondary | ICD-10-CM | POA: Diagnosis not present

## 2017-02-01 DIAGNOSIS — M9904 Segmental and somatic dysfunction of sacral region: Secondary | ICD-10-CM | POA: Diagnosis not present

## 2017-02-01 DIAGNOSIS — M9903 Segmental and somatic dysfunction of lumbar region: Secondary | ICD-10-CM | POA: Diagnosis not present

## 2017-02-01 DIAGNOSIS — M9905 Segmental and somatic dysfunction of pelvic region: Secondary | ICD-10-CM | POA: Diagnosis not present

## 2017-02-11 ENCOUNTER — Emergency Department (HOSPITAL_COMMUNITY)
Admission: EM | Admit: 2017-02-11 | Discharge: 2017-02-11 | Disposition: A | Discharge status: Home / Self Care | Payer: BLUE CROSS/BLUE SHIELD | Attending: Emergency Medicine | Admitting: Emergency Medicine

## 2017-02-11 ENCOUNTER — Emergency Department (HOSPITAL_COMMUNITY): Payer: BLUE CROSS/BLUE SHIELD

## 2017-02-11 ENCOUNTER — Encounter (HOSPITAL_COMMUNITY): Payer: Self-pay

## 2017-02-11 DIAGNOSIS — M5442 Lumbago with sciatica, left side: Secondary | ICD-10-CM | POA: Diagnosis not present

## 2017-02-11 DIAGNOSIS — Z79899 Other long term (current) drug therapy: Secondary | ICD-10-CM | POA: Diagnosis not present

## 2017-02-11 DIAGNOSIS — M545 Low back pain: Secondary | ICD-10-CM | POA: Diagnosis not present

## 2017-02-11 DIAGNOSIS — G8911 Acute pain due to trauma: Secondary | ICD-10-CM | POA: Diagnosis not present

## 2017-02-11 DIAGNOSIS — J45909 Unspecified asthma, uncomplicated: Secondary | ICD-10-CM | POA: Insufficient documentation

## 2017-02-11 DIAGNOSIS — M549 Dorsalgia, unspecified: Secondary | ICD-10-CM | POA: Diagnosis not present

## 2017-02-11 MED ORDER — IBUPROFEN 800 MG PO TABS: 800.0000 mg | ORAL_TABLET | Freq: Three times a day (TID) | ORAL | 0 refills | Status: DC

## 2017-02-11 MED ORDER — MORPHINE SULFATE (PF) 4 MG/ML IV SOLN
4.0000 mg | Freq: Once | INTRAVENOUS | Status: AC
  Administered 2017-02-11: 4 mg via INTRAMUSCULAR
  Filled 2017-02-11: qty 1

## 2017-02-11 MED ORDER — FAMOTIDINE 20 MG PO TABS: 20.0000 mg | ORAL_TABLET | Freq: Two times a day (BID) | ORAL | 0 refills | Status: DC

## 2017-02-11 MED ORDER — KETOROLAC TROMETHAMINE 60 MG/2ML IM SOLN
60.0000 mg | Freq: Once | INTRAMUSCULAR | Status: AC
  Administered 2017-02-11: 60 mg via INTRAMUSCULAR
  Filled 2017-02-11: qty 2

## 2017-02-11 MED ORDER — METHYLPREDNISOLONE 4 MG PO TBPK: ORAL_TABLET | ORAL | 0 refills | Status: DC

## 2017-02-11 NOTE — ED Triage Notes (Signed)
Per EMS- Patient states she bent over while at work and felt a pinch/pull above the left hip. Pain radiates down the left leg. Patient rates pain 9/10 and is worse with movement.

## 2017-02-11 NOTE — ED Provider Notes (Signed)
[facility]-EMERGENCY DEPT Provider Note   CSN: [id number] Arrival date & time: 02/11/17  1210     History   Chief Complaint Chief Complaint  Patient presents with  . Back Pain    HPI Amy Gaines is a 31 y.o. female.  The history is provided by the patient. No language interpreter was used.  Back Pain      Amy Gaines is a 31 y.o. female who presents to the Emergency Department complaining of back pain.  She reports acute onset low back pain that started today about 11:00. She states that she had bent over to pick something up when she went to stand Straight she developed severe, acute pain in the left lower back. Pain is sharp and stabbing in nature. She occasionally has tingling that radiates down her left leg. Pain is worse with moving. She denies any numbness or weakness., Night sweats, weight loss, abdominal pain, vomiting, dysuria.  Past Medical History:  Diagnosis Date  . Anemia   . Asthma     Patient Active Problem List   Diagnosis Date Noted  . Breast lump on left side at 4 o'clock position 07/11/2012  . Breast lump on right side at 5 o'clock position 07/11/2012    Past Surgical History:  Procedure Laterality Date  . lump removed from left axilla  2007    OB History    Gravida Para Term Preterm AB Living   2 2 2     2    SAB TAB Ectopic Multiple Live Births                   Home Medications    Prior to Admission medications   Medication Sig Start Date End Date Taking? Authorizing Provider  sertraline (ZOLOFT) 50 MG tablet Take 1 tablet (50 mg total) by mouth daily. 09/20/16  Yes Ofilia Neaslark, Michael L, PA-C  albuterol (PROVENTIL HFA;VENTOLIN HFA) 108 (90 Base) MCG/ACT inhaler Inhale 2 puffs into the lungs every 6 (six) hours as needed for wheezing or shortness of breath. OK TO DISPENSE PREFERRED PRO AIR OR VENTOLIN Patient not taking: Reported on 02/11/2017 10/05/16   Ofilia Neaslark, Michael L, PA-C  famotidine (PEPCID) 20 MG tablet Take 1 tablet (20 mg total)  by mouth 2 (two) times daily. 02/11/17   [facility] Fossaees, Nidia Grogan, MD  ibuprofen (ADVIL,MOTRIN) 800 MG tablet Take 1 tablet (800 mg total) by mouth 3 (three) times daily. 02/11/17   [facility] Fossaees, Derl Abalos, MD  methylPREDNISolone (MEDROL DOSEPAK) 4 MG TBPK tablet Take according to label instructions 02/11/17   [facility] Fossaees, Sherah Lund, MD    Family History Family History  Problem Relation Age of Onset  . Diabetes Mother   . Heart disease Mother   . Hypertension Mother   . Cancer Maternal Grandmother        cervical  . Diabetes Maternal Grandmother   . Heart disease Maternal Grandmother     Social History Social History  Substance Use Topics  . Smoking status: Never Smoker  . Smokeless tobacco: Never Used  . Alcohol use No     Allergies   Patient has no known allergies.   Review of Systems Review of Systems  Musculoskeletal: Positive for back pain.  All other systems reviewed and are negative.    Physical Exam Updated Vital Signs BP 119/76 (BP Location: Left Arm)   Pulse 72   Temp 98.1 F (36.7 C) (Oral)   Resp 18   Ht 5\' 4"  (1.626 m)   Wt 61.2 kg (135  lb)   LMP 01/11/2017 (LMP Unknown)   SpO2 100%   BMI 23.17 kg/m   Physical Exam  Constitutional: She is oriented to person, place, and time. She appears well-developed and well-nourished.  Uncomfortable appearing  HENT:  Head: Normocephalic and atraumatic.  Cardiovascular: Normal rate and regular rhythm.   No murmur heard. Pulmonary/Chest: Effort normal and breath sounds normal. No respiratory distress.  Abdominal: Soft. There is no tenderness. There is no rebound and no guarding.  Musculoskeletal: She exhibits no edema.  2+ DP pulses bilaterally. There is tenderness to palpation over the left lower back just lateral to midline. Pain is reproducible on straight leg raise on the left.  Neurological: She is alert and oriented to person, place, and time.  5 out of 5 strength in all 4 extremities. Sensation to light touch intact in all 4  extremities.  Skin: Skin is warm and dry.  Psychiatric: She has a normal mood and affect. Her behavior is normal.  Nursing note and vitals reviewed.    ED Treatments / Results  Labs (all labs ordered are listed, but only abnormal results are displayed) Labs Reviewed - No data to display  EKG  EKG Interpretation None       Radiology Dg Lumbar Spine Complete  Result Date: 02/11/2017 CLINICAL DATA:  Severe left-sided lumbar pain. EXAM: LUMBAR SPINE - COMPLETE 4+ VIEW COMPARISON:  None. FINDINGS: Alignment is normal. No fracture line or displaced fracture fragment seen. No acute appearing cortical irregularity or osseous lesion. No evidence of pars interarticularis defect. No appreciable degenerative change. Paravertebral soft tissues are unremarkable. IMPRESSION: Negative. Electronically Signed   By: Bary Richard M.D.   On: 02/11/2017 14:37    Procedures Procedures (including critical care time)  Medications Ordered in ED Medications  ketorolac (TORADOL) injection 60 mg (60 mg Intramuscular Given 02/11/17 1428)  morphine 4 MG/ML injection 4 mg (4 mg Intramuscular Given 02/11/17 1429)     Initial Impression / Assessment and Plan / ED Course  I have reviewed the triage vital signs and the nursing notes.  Pertinent labs & imaging results that were available during my care of the patient were reviewed by me and considered in my medical decision making (see chart for details).     Patient here for evaluation of acute low back pain after bending over. She is neurovascularly intact on examination. History of presentation is consistent with sciatica. No red flag features. Denies IV drug use, weight loss, night sweats. Presentation is not consistent with cauda equina or epidural abscess. She is feeling improved after treatment in the department. Plan to DC home with outpatient follow-up and return precautions.  Final Clinical Impressions(s) / ED Diagnoses   Final diagnoses:  Acute  left-sided low back pain with left-sided sciatica    New Prescriptions New Prescriptions   FAMOTIDINE (PEPCID) 20 MG TABLET    Take 1 tablet (20 mg total) by mouth 2 (two) times daily.   IBUPROFEN (ADVIL,MOTRIN) 800 MG TABLET    Take 1 tablet (800 mg total) by mouth 3 (three) times daily.   METHYLPREDNISOLONE (MEDROL DOSEPAK) 4 MG TBPK TABLET    Take according to label instructions     [facility] Fossa, MD 02/11/17 [phone]

## 2017-02-18 ENCOUNTER — Ambulatory Visit: Payer: BLUE CROSS/BLUE SHIELD | Admitting: Physician Assistant

## 2017-02-24 ENCOUNTER — Ambulatory Visit (INDEPENDENT_AMBULATORY_CARE_PROVIDER_SITE_OTHER): Payer: BLUE CROSS/BLUE SHIELD | Admitting: Physician Assistant

## 2017-02-24 ENCOUNTER — Encounter: Payer: Self-pay | Admitting: Physician Assistant

## 2017-02-24 VITALS — BP 113/72 | HR 79 | Temp 98.0°F | Resp 16 | Ht 64.0 in | Wt 145.2 lb

## 2017-02-24 DIAGNOSIS — M545 Low back pain: Secondary | ICD-10-CM | POA: Diagnosis not present

## 2017-02-24 LAB — POCT URINALYSIS DIP (MANUAL ENTRY)
BILIRUBIN UA: NEGATIVE
BILIRUBIN UA: NEGATIVE mg/dL
Blood, UA: NEGATIVE
Glucose, UA: NEGATIVE mg/dL
LEUKOCYTES UA: NEGATIVE
Nitrite, UA: NEGATIVE
PH UA: 5.5 (ref 5.0–8.0)
Protein Ur, POC: NEGATIVE mg/dL
Spec Grav, UA: 1.02 (ref 1.010–1.025)
Urobilinogen, UA: 0.2 E.U./dL

## 2017-02-24 LAB — POCT URINE PREGNANCY: Preg Test, Ur: NEGATIVE

## 2017-02-24 MED ORDER — CYCLOBENZAPRINE HCL 10 MG PO TABS: 5.0000 mg | ORAL_TABLET | Freq: Three times a day (TID) | ORAL | 0 refills | Status: DC | PRN

## 2017-02-24 MED ORDER — MELOXICAM 15 MG PO TABS: 15.0000 mg | ORAL_TABLET | Freq: Every day | ORAL | 0 refills | Status: DC

## 2017-02-24 NOTE — Patient Instructions (Signed)
     IF you received an x-ray today, you will receive an invoice from [facility]. Please contact [facility] at [phone] with questions or concerns regarding your invoice.   IF you received labwork today, you will receive an invoice from [facility]. Please contact [facility] at [phone] with questions or concerns regarding your invoice.   Our billing staff will not be able to assist you with questions regarding bills from these companies.  You will be contacted with the lab results as soon as they are available. The fastest way to get your results is to activate your My Chart account. Instructions are located on the last page of this paperwork. If you have not heard from us regarding the results in 2 weeks, please contact this office.     

## 2017-02-24 NOTE — Progress Notes (Signed)
02/24/2017 5:14 PM   DOB: 1985-08-10 / MRN: [id number]  SUBJECTIVE:  Amy Gaines is a 31 y.o. female presenting for back pain.  Seen in the ED for same on 02/11/17 and had negative rads there, a shot of toradol, and steroids PO and today she tells me that she feels no better.  Her exam was intact at that time. Denies weakness in the extremities and loss of bowel or bladder control. Has tried seeing a chiropractor and tells me that these occurred before   She has No Known Allergies.   She  has a past medical history of Anemia and Asthma.    She  reports that she has never smoked. She has never used smokeless tobacco. She reports that she does not drink alcohol or use drugs. She  reports that she currently engages in sexual activity. She reports using the following method of birth control/protection: Condom. The patient  has a past surgical history that includes lump removed from left axilla (2007).  Her family history includes Cancer in her maternal grandmother; Diabetes in her maternal grandmother and mother; Heart disease in her maternal grandmother and mother; Hypertension in her mother.  Review of Systems  Constitutional: Negative for chills, diaphoresis and fever.  Eyes: Negative.   Respiratory: Negative for shortness of breath.   Cardiovascular: Negative for chest pain, orthopnea and leg swelling.  Gastrointestinal: Negative for abdominal pain and nausea.  Genitourinary: Negative for dysuria, flank pain, frequency, hematuria and urgency.  Skin: Negative for rash.  Neurological: Negative for dizziness, sensory change, speech change, focal weakness and headaches.    The problem list and medications were reviewed and updated by myself where necessary and exist elsewhere in the encounter.   OBJECTIVE:  BP 113/72 (BP Location: Right Arm, Patient Position: Sitting, Cuff Size: Large)   Pulse 79   Temp 98 F (36.7 C) (Oral)   Resp 16   Ht  (1.626 m)   Wt 145 lb 3.2 oz  (65.9 kg)   LMP 01/11/2017 (LMP Unknown)   SpO2 99%   BMI 24.92 kg/m   Physical Exam  Constitutional: She is oriented to person, place, and time. She is active.  Eyes: Pupils are equal, round, and reactive to light. EOM are normal.  Cardiovascular: Normal rate.   Pulmonary/Chest: Effort normal. No tachypnea.  Musculoskeletal: Normal range of motion. She exhibits tenderness (left piriformis). She exhibits no edema (left piriformis).  Neurological: She is alert and oriented to person, place, and time. She has normal strength and normal reflexes. She is not disoriented. She displays no atrophy and normal reflexes. No cranial nerve deficit or sensory deficit. She exhibits normal muscle tone. Coordination and gait normal.  Skin: Skin is warm. No rash noted.  Psychiatric: Her behavior is normal.    Results for orders placed or performed in visit on 02/24/17 (from the past 72 hour(s))  POCT urinalysis dipstick     Status: None   Collection Time: 02/24/17  4:40 PM  Result Value Ref Range   Color, UA yellow yellow   Clarity, UA clear clear   Glucose, UA negative negative mg/dL   Bilirubin, UA negative negative   Ketones, POC UA negative negative mg/dL   Spec Grav, UA 7.829 5.621 - 1.025   Blood, UA negative negative   pH, UA 5.5 5.0 - 8.0   Protein Ur, POC negative negative mg/dL   Urobilinogen, UA 0.2 0.2 or 1.0 E.U./dL   Nitrite, UA Negative Negative  Leukocytes, UA Negative Negative  POCT urine pregnancy     Status: None   Collection Time: 02/24/17  4:40 PM  Result Value Ref Range   Preg Test, Ur Negative Negative    No results found.  ASSESSMENT AND PLAN:  [name] was seen today for back pain.  Diagnoses and all orders for this visit:  Acute low back pain, unspecified back pain laterality, with sciatica presence unspecified -     POCT urinalysis dipstick -     POCT urine pregnancy -     meloxicam (MOBIC) 15 MG tablet; Take 1 tablet (15 mg total) by mouth daily. -      cyclobenzaprine (FLEXERIL) 10 MG tablet; Take 0.5-1 tablets (5-10 mg total) by mouth 3 (three) times daily as needed.    The patient is advised to call or return to clinic if she does not see an improvement in symptoms, or to seek the care of the closest emergency department if she worsens with the above plan.   Deliah Boston, MHS, PA-C [facility] 02/24/2017 5:14 PM

## 2017-10-20 ENCOUNTER — Other Ambulatory Visit: Payer: Self-pay | Admitting: Physician Assistant

## 2017-10-27 ENCOUNTER — Ambulatory Visit: Payer: BLUE CROSS/BLUE SHIELD | Admitting: Physician Assistant

## 2017-11-18 ENCOUNTER — Encounter: Payer: Self-pay | Admitting: Physician Assistant

## 2017-11-18 ENCOUNTER — Ambulatory Visit (INDEPENDENT_AMBULATORY_CARE_PROVIDER_SITE_OTHER): Payer: BLUE CROSS/BLUE SHIELD | Admitting: Physician Assistant

## 2017-11-18 VITALS — BP 106/66 | HR 65 | Temp 98.1°F | Resp 17 | Ht 66.0 in | Wt 152.0 lb

## 2017-11-18 DIAGNOSIS — Z13220 Encounter for screening for lipoid disorders: Secondary | ICD-10-CM | POA: Diagnosis not present

## 2017-11-18 DIAGNOSIS — F341 Dysthymic disorder: Secondary | ICD-10-CM | POA: Diagnosis not present

## 2017-11-18 DIAGNOSIS — Z130 Encounter for screening for diseases of the blood and blood-forming organs and certain disorders involving the immune mechanism: Secondary | ICD-10-CM | POA: Diagnosis not present

## 2017-11-18 DIAGNOSIS — Z0000 Encounter for general adult medical examination without abnormal findings: Secondary | ICD-10-CM | POA: Diagnosis not present

## 2017-11-18 DIAGNOSIS — Z1329 Encounter for screening for other suspected endocrine disorder: Secondary | ICD-10-CM

## 2017-11-18 DIAGNOSIS — R1013 Epigastric pain: Secondary | ICD-10-CM

## 2017-11-18 DIAGNOSIS — Z13228 Encounter for screening for other metabolic disorders: Secondary | ICD-10-CM | POA: Diagnosis not present

## 2017-11-18 LAB — CMP14+EGFR
ALT: 13 IU/L (ref 0–32)
AST: 16 IU/L (ref 0–40)
Albumin/Globulin Ratio: 2 (ref 1.2–2.2)
Albumin: 4.7 g/dL (ref 3.5–5.5)
Alkaline Phosphatase: 67 IU/L (ref 39–117)
BUN/Creatinine Ratio: 19 (ref 9–23)
BUN: 11 mg/dL (ref 6–20)
Bilirubin Total: <0.2 mg/dL (ref 0.0–1.2)
CO2: 22 mmol/L (ref 20–29)
Calcium: 9.4 mg/dL (ref 8.7–10.2)
Chloride: 105 mmol/L (ref 96–106)
Creatinine, Ser: 0.58 mg/dL (ref 0.57–1.00)
GFR calc Af Amer: 139 mL/min/{1.73_m2} (ref >59)
GFR calc non Af Amer: 121 mL/min/{1.73_m2} (ref >59)
Globulin, Total: 2.3 g/dL (ref 1.5–4.5)
Glucose: 83 mg/dL (ref 65–99)
Potassium: 4.5 mmol/L (ref 3.5–5.2)
Sodium: 140 mmol/L (ref 134–144)
Total Protein: 7 g/dL (ref 6.0–8.5)

## 2017-11-18 LAB — LIPID PANEL
Chol/HDL Ratio: 4.3 ratio (ref 0.0–4.4)
Cholesterol, Total: 183 mg/dL (ref 100–199)
HDL: 43 mg/dL (ref >39)
LDL Calculated: 101 mg/dL — ABNORMAL HIGH (ref 0–99)
Triglycerides: 195 mg/dL — ABNORMAL HIGH (ref 0–149)
VLDL Cholesterol Cal: 39 mg/dL (ref 5–40)

## 2017-11-18 LAB — CBC WITH DIFFERENTIAL/PLATELET
Basophils Absolute: 0 10*3/uL (ref 0.0–0.2)
Basos: 1 %
EOS (ABSOLUTE): 0.1 10*3/uL (ref 0.0–0.4)
Eos: 1 %
Hematocrit: 40.6 % (ref 34.0–46.6)
Hemoglobin: 12.9 g/dL (ref 11.1–15.9)
Immature Grans (Abs): 0 10*3/uL (ref 0.0–0.1)
Immature Granulocytes: 1 %
Lymphocytes Absolute: 1.8 10*3/uL (ref 0.7–3.1)
Lymphs: 28 %
MCH: 28.7 pg (ref 26.6–33.0)
MCHC: 31.8 g/dL (ref 31.5–35.7)
MCV: 90 fL (ref 79–97)
Monocytes Absolute: 0.4 10*3/uL (ref 0.1–0.9)
Monocytes: 7 %
Neutrophils Absolute: 4 10*3/uL (ref 1.4–7.0)
Neutrophils: 62 %
Platelets: 311 10*3/uL (ref 150–450)
RBC: 4.5 x10E6/uL (ref 3.77–5.28)
RDW: 13.7 % (ref 12.3–15.4)
WBC: 6.3 10*3/uL (ref 3.4–10.8)

## 2017-11-18 LAB — POCT URINALYSIS DIP (MANUAL ENTRY)
Bilirubin, UA: NEGATIVE
Glucose, UA: NEGATIVE mg/dL
Ketones, POC UA: NEGATIVE mg/dL
Leukocytes, UA: NEGATIVE
Nitrite, UA: NEGATIVE
Protein Ur, POC: NEGATIVE mg/dL
Spec Grav, UA: <=1.005 — AB (ref 1.010–1.025)
Urobilinogen, UA: 0.2 E.U./dL
pH, UA: 6 (ref 5.0–8.0)

## 2017-11-18 MED ORDER — SERTRALINE HCL 25 MG PO TABS: 50.0000 mg | ORAL_TABLET | Freq: Every day | ORAL | 1 refills | Status: DC

## 2017-11-18 NOTE — Progress Notes (Signed)
[facility] at [facility], [place] [address] [phone]  Date:  11/18/2017   Name:  Amy Gaines   DOB:  Aug 21, 1985   MRN:  [id number]  PCP:  Patient, No Pcp Per    Chief Complaint: Annual Exam   History of Present Illness:  This is a 32 y.o. female with PMH asthma who is presenting for CPE. Last cpe 07/2016.  She is fasting today.   H/o asthma - controlled. She has albuterol inhaler PRN, which is about 1-2x/month. No nighttime awakenings.   Dysthymia - controlled with Zoloft 27m x 1 year. She is interested in discontinuing this medication. "things are a lot better now" she walks every morning, which helps her.   Complaints: abdominal pain and bloating after she eats. Pain is not there when she is not eating. This has been presents about 6 months. Worse with cheese and milk.  LMP: yesterday  Contraception:  none Last pap: she went to a GYN for last few PAPs. 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  Sexual history: active with husband only.  Immunizations: utd  Denitist: regular check ups.  Eye: 20/20 b/l  Diet/Exercise: walks most days of the week.  Fam hx: family history includes Cancer in her maternal grandmother; Diabetes in her maternal grandmother and mother; Heart disease in her maternal grandmother and mother; Hypertension in her mother.  Tobacco/alcohol/substance use: never smoker, no alcohol or drug use.   Review of Systems:  Review of Systems  Constitutional: Negative for chills, diaphoresis, fatigue and fever.  HENT: Negative for congestion, postnasal drip, rhinorrhea, sinus pressure, sneezing and sore throat.   Respiratory: Negative for cough, chest tightness, shortness of breath and wheezing.   Cardiovascular: Negative for chest pain and palpitations.  Gastrointestinal: Positive for abdominal distention ("bloating") and abdominal pain. Negative for diarrhea, nausea and vomiting.  Genitourinary: Negative for decreased urine  volume, difficulty urinating, dysuria, enuresis, flank pain, frequency, hematuria and urgency.  Musculoskeletal: Negative for back pain.  Neurological: Negative for dizziness, weakness, light-headedness and headaches.    Patient Active Problem List   Diagnosis Date Noted  . Breast lump on left side at 4 o'clock position 07/11/2012  . Breast lump on right side at 5 o'clock position 07/11/2012    Prior to Admission medications   Medication Sig Start Date End Date Taking? Authorizing Provider  albuterol (PROVENTIL HFA;VENTOLIN HFA) 108 (90 Base) MCG/ACT inhaler Inhale 2 puffs into the lungs every 6 (six) hours as needed for wheezing or shortness of breath. OK TO DISPENSE PREFERRED PRO AIR OR VENTOLIN 10/05/16  Yes CTereasa Coop PA-C  ibuprofen (ADVIL,MOTRIN) 800 MG tablet Take 1 tablet (800 mg total) by mouth 3 (three) times daily. 02/11/17  Yes RQuintella Reichert MD  sertraline (ZOLOFT) 50 MG tablet TAKE 1 TABLET BY MOUTH EVERY DAY 10/20/17  Yes CTereasa Coop PA-C    No Known Allergies  Past Surgical History:  Procedure Laterality Date  . lump removed from left axilla  2007    Social History   Tobacco Use  . Smoking status: Never Smoker  . Smokeless tobacco: Never Used  Substance Use Topics  . Alcohol use: No  . Drug use: No    Family History  Problem Relation Age of Onset  . Diabetes Mother   . Heart disease Mother   . Hypertension Mother   . Cancer Maternal Grandmother        cervical  . Diabetes Maternal Grandmother   .

## 2017-11-18 NOTE — Patient Instructions (Addendum)
Zoloft - start taking Zoloft 77m daily for the next 3 weeks. After this take half a tablet every day for 3 weeks. After this take half tablet every other day for 3 weeks. Then stop.   Find out what the results were from your last PAP and when they recommend you have one. We can do that here if needed.    Start taking Lactaid. Avoid dairy. See if this helps your symptoms. Come back and see me in 3-4 weeks if you are not improving.   Lactose Intolerance, Adult Lactose is the natural sugar found in milk and milk products, such as cheese and yogurt. Lactose is digested by lactase, an enzyme in your small intestine. Some people do not produce enough lactase to digest lactose. This is called lactose intolerance. Lactose intolerance is different from milk allergy, which is a more serious reaction to the protein in milk. What are the causes? Causes of lactose intolerance may include:  Normal aging. The ability to produce lactase may decline with age, causing lactose intolerance over time.  Being born without the ability to make lactase.  Digestive diseases such as gastroenteritis or inflammatory bowel disease.  Surgery or injuries to your small intestine.  Infection in your intestines.  Certain antibiotic medicines and cancer treatments.  What are the signs or symptoms? Lactose intolerance can cause uncomfortable symptoms. These are likely to occur within 30 minutes to 2 hours after eating or drinking foods containing lactose. Symptoms of lactose intolerance may include:  Nausea.  Diarrhea.  Abdominal cramps or pain.  Bloating.  Gas.  How is this diagnosed? There are several tests your health care provider can do to diagnose lactose intolerance. These tests include a hydrogen breath test and stool acidity test. How is this treated? No treatment can improve your body's ability to produce lactase. However, your symptoms can be controlled by limiting or avoiding milk products and other  sources of lactose and adjusting your diet. Lactose-free milk is often tolerated. Lactose digestion may also be improved by adding lactase drops to regular milk or by taking lactase tablets when dairy products are consumed. Tolerance to lactose is individual. Some people may be able to eat or drink small amounts of products with lactose, while other may need to avoid lactose entirely. Talk to your health care provider about what is best for you. Follow these instructions at home:  Limit or avoidfoods, beverages, and medicines containing lactose as directed by your health care provider.  Read food and medicine labels carefully to avoid products containing lactose, milk solids, casein, or whey.  If you eliminate dairy products, replace the protein, calcium, vitamin D, and other nutrients they contain through other foods. A registered dietitian or your health care provider can help you adjust your diet.  Choose a milk substitute that is fortified with calcium and vitamin D. Be aware that soy milk contains high quality protein, while milks made from nuts or grains contain very little protein.  Use lactase drops or tablets if directed by your health care provider. Contact a health care provider if: You have no relief from your symptoms after eliminating milk products and other sources of lactose. This information is not intended to replace advice given to you by your health care provider. Make sure you discuss any questions you have with your health care provider. Document Released: 05/24/2005 Document Revised: 10/30/2015 Document Reviewed: 08/24/2013 Elsevier Interactive Patient Education  2018 [place], [place] Adopting a healthy lifestyle and getting  preventive care can go a long way to promote health and wellness. Talk with your health care provider about what schedule of regular examinations is right for you. This is a good chance for you to check in with your provider  about disease prevention and staying healthy. In between checkups, there are plenty of things you can do on your own. Experts have done a lot of research about which lifestyle changes and preventive measures are most likely to keep you healthy. Ask your health care provider for more information. Weight and diet Eat a healthy diet  Be sure to include plenty of vegetables, fruits, low-fat dairy products, and lean protein.  Do not eat a lot of foods high in solid fats, added sugars, or salt.  Get regular exercise. This is one of the most important things you can do for your health. ? Most adults should exercise for at least 150 minutes each week. The exercise should increase your heart rate and make you sweat (moderate-intensity exercise). ? Most adults should also do strengthening exercises at least twice a week. This is in addition to the moderate-intensity exercise.  Maintain a healthy weight  Body mass index (BMI) is a measurement that can be used to identify possible weight problems. It estimates body fat based on height and weight. Your health care provider can help determine your BMI and help you achieve or maintain a healthy weight.  For females 70 years of age and older: ? A BMI below 18.5 is considered underweight. ? A BMI of 18.5 to 24.9 is normal. ? A BMI of 25 to 29.9 is considered overweight. ? A BMI of 30 and above is considered obese.  Watch levels of cholesterol and blood lipids  You should start having your blood tested for lipids and cholesterol at 33 years of age, then have this test every 5 years.  You may need to have your cholesterol levels checked more often if: ? Your lipid or cholesterol levels are high. ? You are older than 34 years of age. ? You are at high risk for heart disease.  Cancer screening Lung Cancer  Lung cancer screening is recommended for adults 40-45 years old who are at high risk for lung cancer because of a history of smoking.  A yearly  low-dose CT scan of the lungs is recommended for people who: ? Currently smoke. ? Have quit within the past 15 years. ? Have at least a 30-pack-year history of smoking. A pack year is smoking an average of one pack of cigarettes a day for 1 year.  Yearly screening should continue until it has been 15 years since you quit.  Yearly screening should stop if you develop a health problem that would prevent you from having lung cancer treatment.  Breast Cancer  Practice breast self-awareness. This means understanding how your breasts normally appear and feel.  It also means doing regular breast self-exams. Let your health care provider know about any changes, no matter how small.  If you are in your 20s or 30s, you should have a clinical breast exam (CBE) by a health care provider every 1-3 years as part of a regular health exam.  If you are 50 or older, have a CBE every year. Also consider having a breast X-ray (mammogram) every year.  If you have a family history of breast cancer, talk to your health care provider about genetic screening.  If you are at high risk for breast cancer, talk to your health care  provider about having an MRI and a mammogram every year.  Breast cancer gene (BRCA) assessment is recommended for women who have family members with BRCA-related cancers. BRCA-related cancers include: ? Breast. ? Ovarian. ? Tubal. ? Peritoneal cancers.  Results of the assessment will determine the need for genetic counseling and BRCA1 and BRCA2 testing.  Cervical Cancer Your health care provider may recommend that you be screened regularly for cancer of the pelvic organs (ovaries, uterus, and vagina). This screening involves a pelvic examination, including checking for microscopic changes to the surface of your cervix (Pap test). You may be encouraged to have this screening done every 3 years, beginning at age 74.  For women ages 71-65, health care providers may recommend pelvic exams  and Pap testing every 3 years, or they may recommend the Pap and pelvic exam, combined with testing for human papilloma virus (HPV), every 5 years. Some types of HPV increase your risk of cervical cancer. Testing for HPV may also be done on women of any age with unclear Pap test results.  Other health care providers may not recommend any screening for nonpregnant women who are considered low risk for pelvic cancer and who do not have symptoms. Ask your health care provider if a screening pelvic exam is right for you.  If you have had past treatment for cervical cancer or a condition that could lead to cancer, you need Pap tests and screening for cancer for at least 20 years after your treatment. If Pap tests have been discontinued, your risk factors (such as having a new sexual partner) need to be reassessed to determine if screening should resume. Some women have medical problems that increase the chance of getting cervical cancer. In these cases, your health care provider may recommend more frequent screening and Pap tests.  Colorectal Cancer  This type of cancer can be detected and often prevented.  Routine colorectal cancer screening usually begins at 34 years of age and continues through 34 years of age.  Your health care provider may recommend screening at an earlier age if you have risk factors for colon cancer.  Your health care provider may also recommend using home test kits to check for hidden blood in the stool.  A small camera at the end of a tube can be used to examine your colon directly (sigmoidoscopy or colonoscopy). This is done to check for the earliest forms of colorectal cancer.  Routine screening usually begins at age 70.  Direct examination of the colon should be repeated every 5-10 years through 34 years of age. However, you may need to be screened more often if early forms of precancerous polyps or small growths are found.  Skin Cancer  Check your skin from head to  toe regularly.  Tell your health care provider about any new moles or changes in moles, especially if there is a change in a mole's shape or color.  Also tell your health care provider if you have a mole that is larger than the size of a pencil eraser.  Always use sunscreen. Apply sunscreen liberally and repeatedly throughout the day.  Protect yourself by wearing long sleeves, pants, a wide-brimmed hat, and sunglasses whenever you are outside.  Heart disease, diabetes, and high blood pressure  High blood pressure causes heart disease and increases the risk of stroke. High blood pressure is more likely to develop in: ? People who have blood pressure in the high end of the normal range (130-139/85-89 mm Hg). ? People  who are overweight or obese. ? People who are African American.  If you are 82-60 years of age, have your blood pressure checked every 3-5 years. If you are 68 years of age or older, have your blood pressure checked every year. You should have your blood pressure measured twice-once when you are at a hospital or clinic, and once when you are not at a hospital or clinic. Record the average of the two measurements. To check your blood pressure when you are not at a hospital or clinic, you can use: ? An automated blood pressure machine at a pharmacy. ? A home blood pressure monitor.  If you are between 5 years and 71 years old, ask your health care provider if you should take aspirin to prevent strokes.  Have regular diabetes screenings. This involves taking a blood sample to check your fasting blood sugar level. ? If you are at a normal weight and have a low risk for diabetes, have this test once every three years after 34 years of age. ? If you are overweight and have a high risk for diabetes, consider being tested at a younger age or more often. Preventing infection Hepatitis B  If you have a higher risk for hepatitis B, you should be screened for this virus. You are  considered at high risk for hepatitis B if: ? You were born in a country where hepatitis B is common. Ask your health care provider which countries are considered high risk. ? Your parents were born in a high-risk country, and you have not been immunized against hepatitis B (hepatitis B vaccine). ? You have HIV or AIDS. ? You use needles to inject street drugs. ? You live with someone who has hepatitis B. ? You have had sex with someone who has hepatitis B. ? You get hemodialysis treatment. ? You take certain medicines for conditions, including cancer, organ transplantation, and autoimmune conditions.  Hepatitis C  Blood testing is recommended for: ? Everyone born from [birth year] through 1967. ? Anyone with known risk factors for hepatitis C.  Sexually transmitted infections (STIs)  You should be screened for sexually transmitted infections (STIs) including gonorrhea and chlamydia if: ? You are sexually active and are younger than 33 years of age. ? You are older than 34 years of age and your health care provider tells you that you are at risk for this type of infection. ? Your sexual activity has changed since you were last screened and you are at an increased risk for chlamydia or gonorrhea. Ask your health care provider if you are at risk.  If you do not have HIV, but are at risk, it may be recommended that you take a prescription medicine daily to prevent HIV infection. This is called pre-exposure prophylaxis (PrEP). You are considered at risk if: ? You are sexually active and do not regularly use condoms or know the HIV status of your partner(s). ? You take drugs by injection. ? You are sexually active with a partner who has HIV.  Talk with your health care provider about whether you are at high risk of being infected with HIV. If you choose to begin PrEP, you should first be tested for HIV. You should then be tested every 3 months for as long as you are taking PrEP. Pregnancy  If you  are premenopausal and you may become pregnant, ask your health care provider about preconception counseling.  If you may become pregnant, take 400 to 800 micrograms (mcg) of  folic acid every day.  If you want to prevent pregnancy, talk to your health care provider about birth control (contraception). Osteoporosis and menopause  Osteoporosis is a disease in which the bones lose minerals and strength with aging. This can result in serious bone fractures. Your risk for osteoporosis can be identified using a bone density scan.  If you are 73 years of age or older, or if you are at risk for osteoporosis and fractures, ask your health care provider if you should be screened.  Ask your health care provider whether you should take a calcium or vitamin D supplement to lower your risk for osteoporosis.  Menopause may have certain physical symptoms and risks.  Hormone replacement therapy may reduce some of these symptoms and risks. Talk to your health care provider about whether hormone replacement therapy is right for you. Follow these instructions at home:  Schedule regular health, dental, and eye exams.  Stay current with your immunizations.  Do not use any tobacco products including cigarettes, chewing tobacco, or electronic cigarettes.  If you are pregnant, do not drink alcohol.  If you are breastfeeding, limit how much and how often you drink alcohol.  Limit alcohol intake to no more than 1 drink per day for nonpregnant women. One drink equals 12 ounces of beer, 5 ounces of wine, or 1 ounces of hard liquor.  Do not use street drugs.  Do not share needles.  Ask your health care provider for help if you need support or information about quitting drugs.  Tell your health care provider if you often feel depressed.  Tell your health care provider if you have ever been abused or do not feel safe at home. This information is not intended to replace advice given to you by your health care  provider. Make sure you discuss any questions you have with your health care provider. Document Released: 12/07/2010 Document Revised: 10/30/2015 Document Reviewed: 02/25/2015 Elsevier Interactive Patient Education  2018 [employer].  IF you received an x-ray today, you will receive an invoice from [facility]. Please contact [facility] at [phone] with questions or concerns regarding your invoice.   IF you received labwork today, you will receive an invoice from [facility]. Please contact [facility] at [phone] with questions or concerns regarding your invoice.   Our billing staff will not be able to assist you with questions regarding bills from these companies.  You will be contacted with the lab results as soon as they are available. The fastest way to get your results is to activate your My Chart account. Instructions are located on the last page of this paperwork. If you have not heard from [place] regarding the results in 2 weeks, please contact this office.

## 2017-11-21 ENCOUNTER — Telehealth: Payer: Self-pay | Admitting: Physician Assistant

## 2017-11-21 NOTE — Telephone Encounter (Signed)
Pt brought in a copy of their last pap smear. Pt states she was told that if her last pap smear was normal then she wouldn't need another one for 3 years.  I placed the paper in [facility] box. Pt would like a call to see what she should do in regards to needing a pap smear or not.

## 2017-11-24 ENCOUNTER — Encounter: Payer: Self-pay | Admitting: Physician Assistant

## 2017-12-22 ENCOUNTER — Encounter: Payer: Self-pay | Admitting: *Deleted

## 2017-12-29 ENCOUNTER — Encounter: Payer: Self-pay | Admitting: *Deleted

## 2018-04-18 ENCOUNTER — Ambulatory Visit: Payer: BLUE CROSS/BLUE SHIELD | Admitting: Physician Assistant

## 2018-08-21 IMAGING — CR DG LUMBAR SPINE COMPLETE 4+V
5 series · 5 of 5 positions shown · non-contrast
Comparison: None.

CLINICAL DATA: Severe left-sided lumbar pain.

EXAM:
LUMBAR SPINE - COMPLETE 4+ VIEW

[t lumbar spine ap]
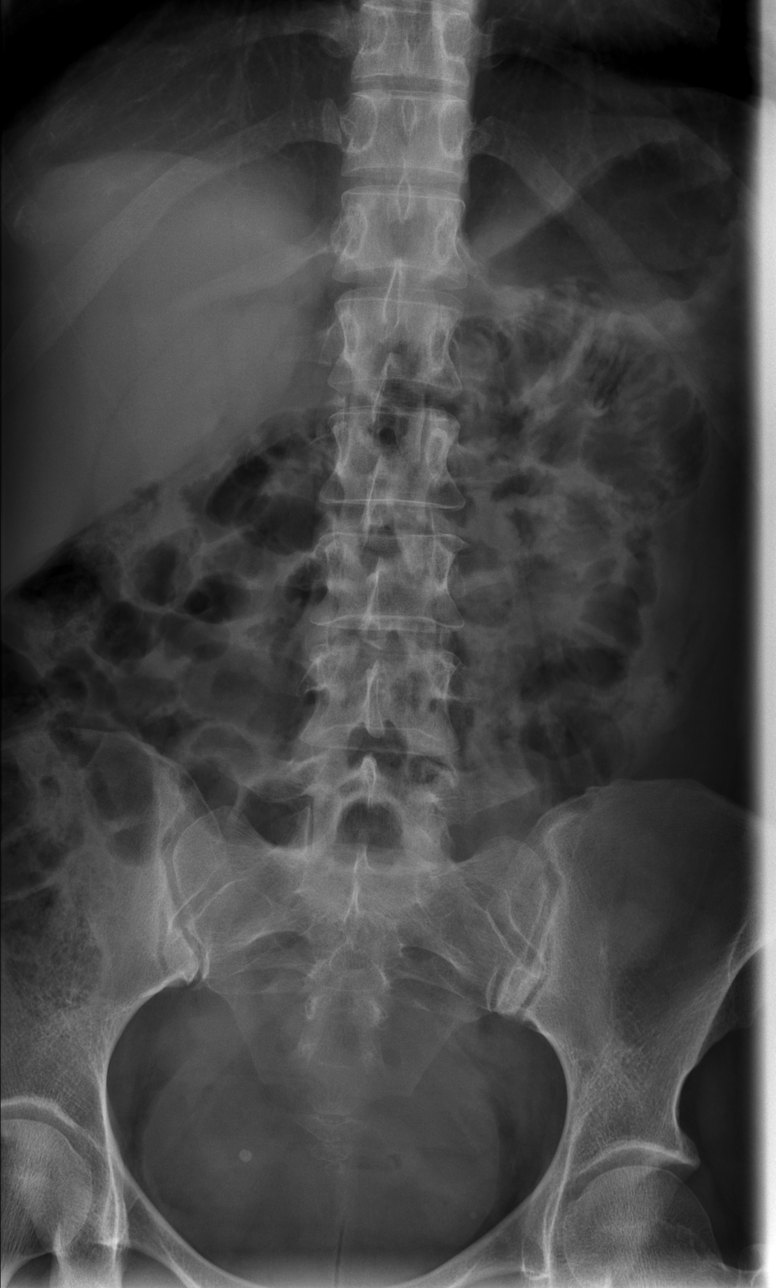

[t lumbar spine obl (1 of 2)]
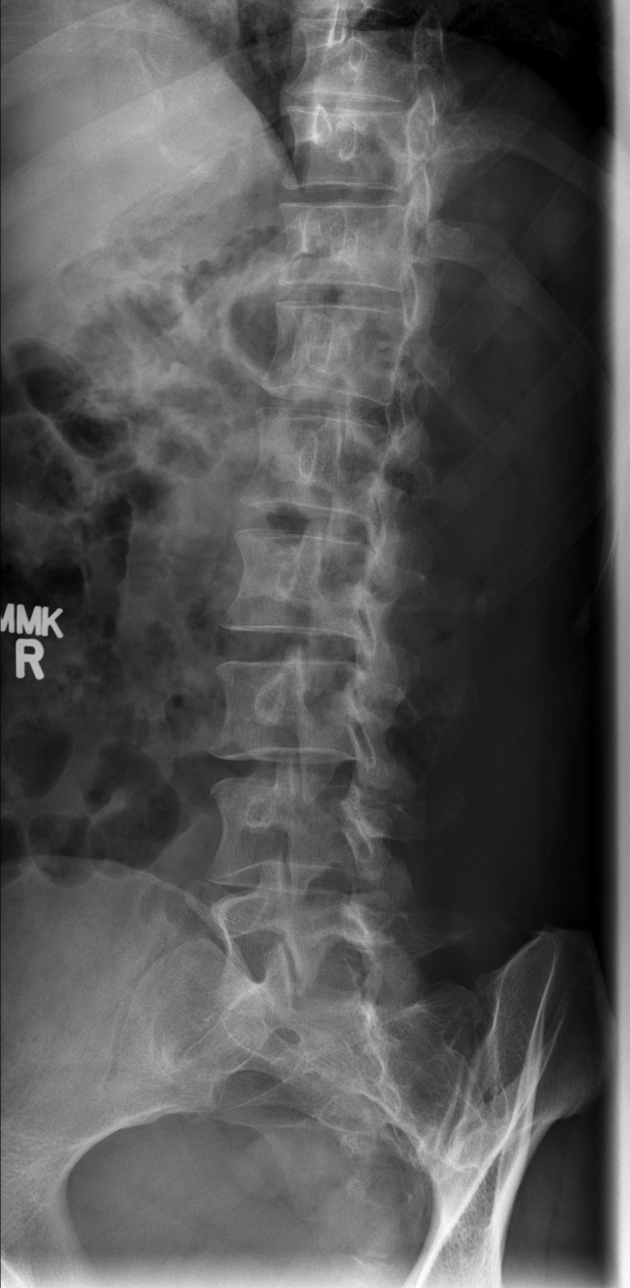

[t lumbar spine obl (2 of 2)]
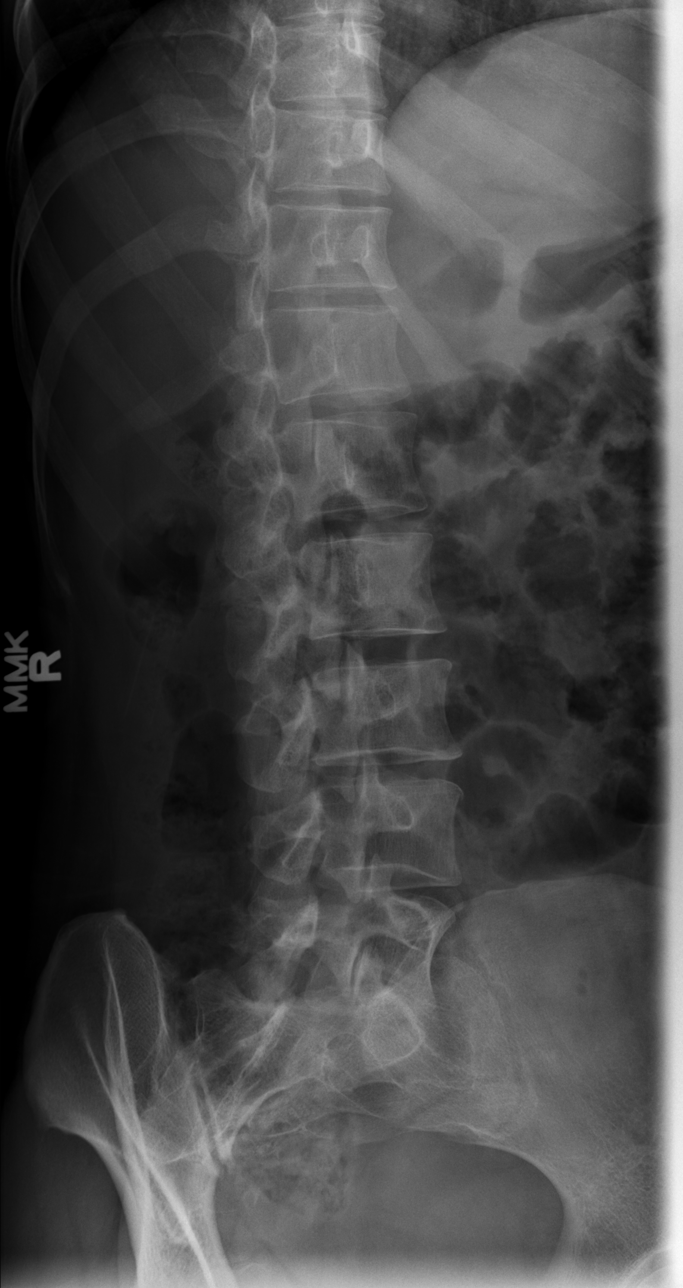

[t lumbar spine lat]
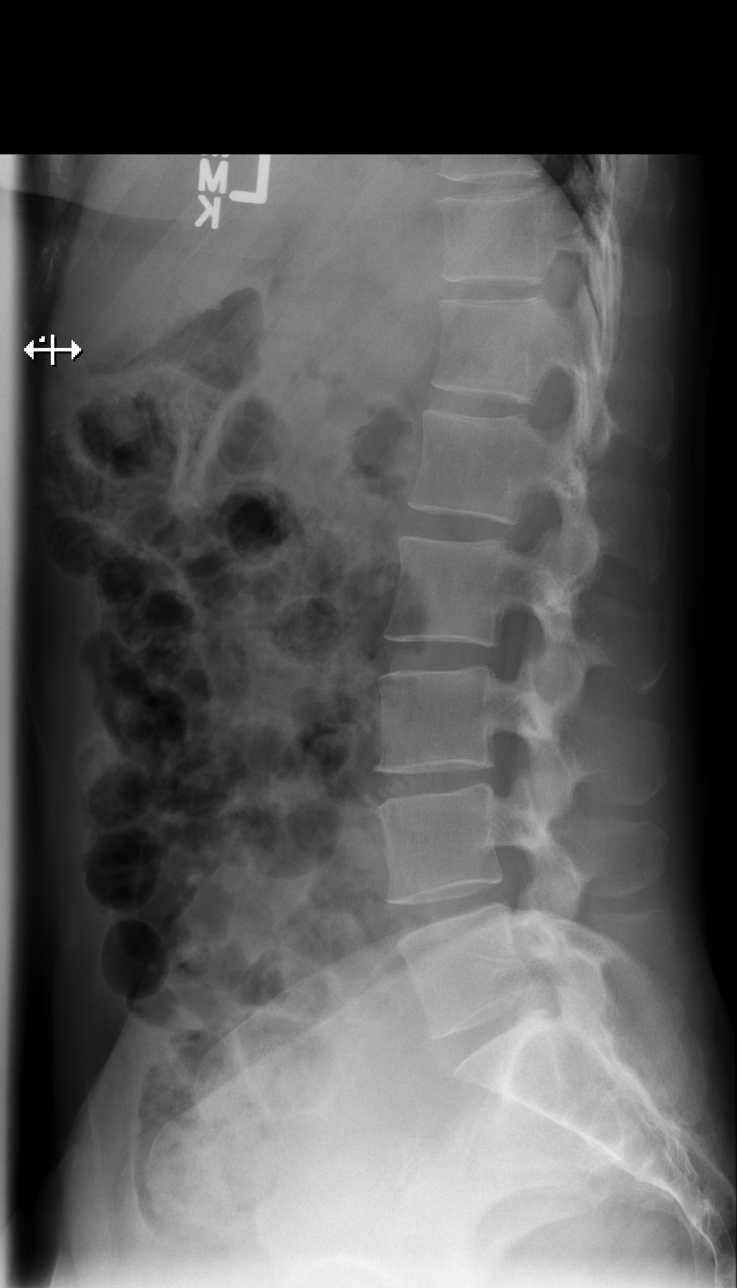

[t lumbar l-5 s-1 spot]
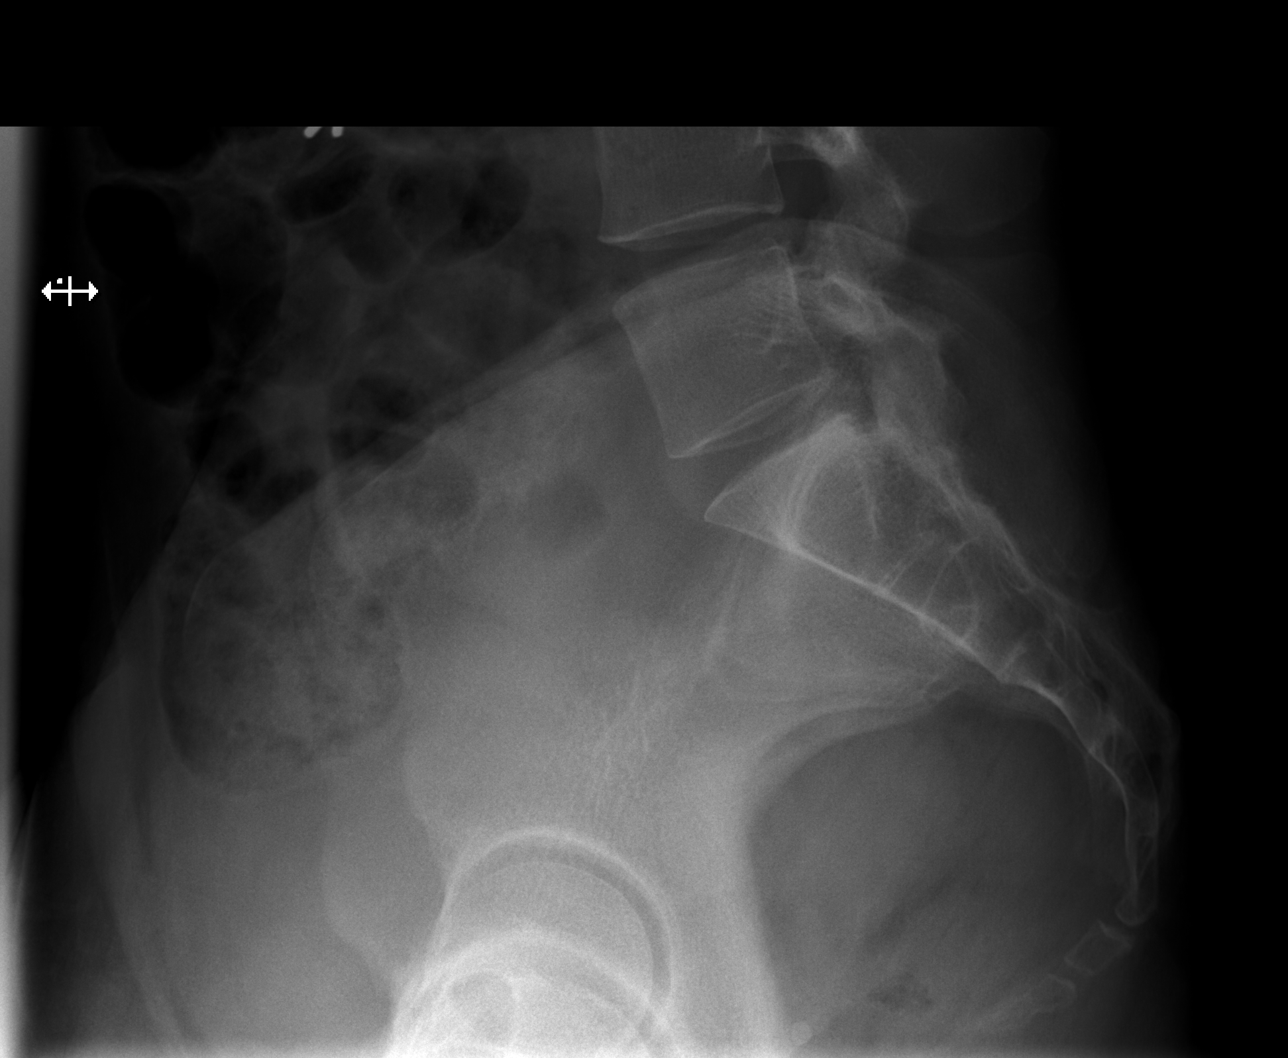

[5 of 5 positions shown; findings below may reference images not displayed]

FINDINGS: Alignment is normal. No fracture line or displaced fracture fragment
seen. No acute appearing cortical irregularity or osseous lesion. No
evidence of pars interarticularis defect. No appreciable
degenerative change. Paravertebral soft tissues are unremarkable.
IMPRESSION: Negative.

## 2018-09-19 ENCOUNTER — Telehealth (INDEPENDENT_AMBULATORY_CARE_PROVIDER_SITE_OTHER): Payer: BLUE CROSS/BLUE SHIELD | Admitting: Family Medicine

## 2018-09-19 ENCOUNTER — Other Ambulatory Visit: Payer: Self-pay

## 2018-09-19 DIAGNOSIS — J4520 Mild intermittent asthma, uncomplicated: Secondary | ICD-10-CM

## 2018-09-19 MED ORDER — ALBUTEROL SULFATE HFA 108 (90 BASE) MCG/ACT IN AERS: 2.0000 | INHALATION_SPRAY | Freq: Four times a day (QID) | RESPIRATORY_TRACT | 3 refills | Status: DC | PRN

## 2018-09-19 NOTE — Patient Instructions (Signed)
Avoid exposure due to asthma Pick up albuterol at [facility]

## 2018-09-19 NOTE — Progress Notes (Signed)
Spoke with pt this morning and her appointment it to follow-up about her Asthma. She needs a refill on her Inhaler.   Pt has not traveled outside the country.   Pt has no other concerns today.

## 2018-09-19 NOTE — Progress Notes (Signed)
Telemedicine Encounter- SOAP NOTE Established Patient  I discussed the limitations, risks, security and privacy concerns of performing an evaluation and management service by telephone and the availability of in person appointments. I also discussed with the patient that there may be a patient responsible charge related to this service. The patient expressed understanding and agreed to proceed.  This telephone encounter was conducted with the patient's  verbal consent via audio telecommunications: yes Patient was instructed to have this encounter in a suitably private space; and to only have persons present to whom they give permission to participate. In addition, patient identity was confirmed by use of name plus two identifiers (DOB and address).  I spent a total of 7min talking with the patient -pt did not need an interpreter  Spoke with pt this morning and her appointment it to follow-up about her Asthma. She needs a refill on her Inhaler.   Pt has not traveled outside the country.   Pt has no other concerns today.   Subjective   Amy Gaines is a 33 y.o. female established patient. Telephone visit today for asthma. Pt needs refills on medication  HPI pt with ongoing asthma-using albuterol for treatment in the past and often uses 2x day. Pt has never used other medication to treat asthma.  Pt using allegra otc for allergic symptoms. Nasal drainage noted but no eye irritation. Pt with left ear pressure, no pain intermittently.  No headache. No sore throat. Pt with SOB associated with exertion and has used inhaler two times a day -more if needed. No h/o steroids-oral or inhaled in the past. [facility] [place] [facility] [place] [facility] Pt has 2 children at home 16yo and 8yo, husband and grandfather. No one smokes.    Patient Active Problem List   Diagnosis Date Noted  . Breast lump on left side at 4 o'clock position 07/11/2012  . Breast lump on right side at 5  o'clock position 07/11/2012    Past Medical History:  Diagnosis Date  . Anemia   . Asthma     Current Outpatient Medications  Medication Sig Dispense Refill  . albuterol (PROVENTIL HFA;VENTOLIN HFA) 108 (90 Base) MCG/ACT inhaler Inhale 2 puffs into the lungs every 6 (six) hours as needed for wheezing or shortness of breath. OK TO DISPENSE PREFERRED PRO AIR OR VENTOLIN 1 Inhaler 1   No current facility-administered medications for this visit.     No Known Allergies  Social History   Socioeconomic History  . Married     Spouse name: Not on file  . 2 children   . Years of education: Not on file  . Highest education level: Not on file  Occupational History  . Not on file  Social Needs  . Financial resource strain: Not on file  . Food insecurity:    Worry: Not on file    Inability: Not on file  . Transportation needs:    Medical: Not on file    Non-medical: Not on file  Tobacco Use  . Smoking status: Never Smoker  . Smokeless tobacco: Never Used  Substance and Sexual Activity  . Alcohol use: No  . Drug use: No  . Sexual activity: Yes    Birth control/protection: Condom  Lifestyle  . Physical activity:    Days per week: Not on file    Minutes per session: Not on file  . Stress: Not on file  Relationships  . Social connections:  Talks on phone: Not on file    Gets together: Not on file    Attends religious service: Not on file    Active member of club or organization: Not on file    Attends meetings of clubs or organizations: Not on file    Relationship status: Not on file  . Intimate partner violence:    Fear of current or ex partner: Not on file    Emotionally abused: Not on file    Physically abused: Not on file    Forced sexual activity: Not on file  Other Topics Concern  . Not on file  Social History Narrative  . Not on file    ROS  CONSTITUTIONAL: no fever,  EENT:no hearing loss, no sinus problems, nasal congestion, left ear pain, no sore  throat,no  facial tenderness CV: no chest pain RESP:  SOB, no cough, wheezing noted with exertion, no sputum productionALLERGY:seasonal allergies   Objective   No vitals-pt and I spoke by telephone Mild intermittent asthma without complication - Plan: albuterol (PROVENTIL HFA;VENTOLIN HFA) 108 (90 Base) MCG/ACT inhaler refill given -pt to call if symptoms do not improve-no additional meds used in the past for asthma. Pt to wear mask when going out to avoid exposure to COVID-19. Pt understands she is at risk for increase severity of symptoms due to asthma if COVID contracted.   I discussed the assessment and treatment plan with the patient. The patient was provided an opportunity to ask questions and all were answered. The patient agreed with the plan and demonstrated an understanding of the instructions.   The patient was advised to call back or seek an in-person evaluation if the symptoms worsen or if the condition fails to improve as anticipated.  I provided of non-face-to-face time during this encounter.   Mat Carne, MD  Primary Care at [facility] 09-19-2018

## 2018-12-14 ENCOUNTER — Ambulatory Visit: Payer: BLUE CROSS/BLUE SHIELD | Admitting: Family Medicine

## 2018-12-15 ENCOUNTER — Encounter: Payer: Self-pay | Admitting: Family Medicine

## 2018-12-19 ENCOUNTER — Ambulatory Visit: Payer: BC Managed Care – PPO | Admitting: Emergency Medicine

## 2018-12-19 ENCOUNTER — Encounter: Payer: Self-pay | Admitting: Emergency Medicine

## 2018-12-19 ENCOUNTER — Other Ambulatory Visit: Payer: Self-pay

## 2018-12-19 VITALS — BP 137/74 | HR 88 | Temp 98.1°F | Ht 65.0 in | Wt 149.4 lb

## 2018-12-19 DIAGNOSIS — R202 Paresthesia of skin: Secondary | ICD-10-CM | POA: Diagnosis not present

## 2018-12-19 DIAGNOSIS — R5383 Other fatigue: Secondary | ICD-10-CM

## 2018-12-19 DIAGNOSIS — G4452 New daily persistent headache (NDPH): Secondary | ICD-10-CM

## 2018-12-19 DIAGNOSIS — R531 Weakness: Secondary | ICD-10-CM | POA: Diagnosis not present

## 2018-12-19 LAB — POCT CBC
Granulocyte percent: 70.5 %G (ref 37–80)
HCT, POC: 39.6 % (ref 29–41)
Hemoglobin: 13.4 g/dL (ref 11–14.6)
Lymph, poc: 1.8 (ref 0.6–3.4)
MCH, POC: 29.7 pg (ref 27–31.2)
MCHC: 33.9 g/dL (ref 31.8–35.4)
MCV: 87.5 fL (ref 76–111)
MID (cbc): 0.4 (ref 0–0.9)
MPV: 8.1 fL (ref 0–99.8)
POC Granulocyte: 5.1 (ref 2–6.9)
POC LYMPH PERCENT: 24.5 %L (ref 10–50)
POC MID %: 5 %M (ref 0–12)
Platelet Count, POC: 278 10*3/uL (ref 142–424)
RBC: 4.52 M/uL (ref 4.04–5.48)
RDW, POC: 13.2 %
WBC: 7.3 10*3/uL (ref 4.6–10.2)

## 2018-12-19 LAB — POCT GLYCOSYLATED HEMOGLOBIN (HGB A1C): Hemoglobin A1C: 5.6 % (ref 4.0–5.6)

## 2018-12-19 NOTE — Patient Instructions (Addendum)
If you have lab work done today you will be contacted with your lab results within the next 2 weeks.  If you have not heard from [place] then please contact us. The fastest way to get your results is to register for My Chart.   IF you received an x-ray today, you will receive an invoice from [facility]. Please contact [facility] at [phone] with questions or concerns regarding your invoice.   IF you received labwork today, you will receive an invoice from [place]. Please contact [facility] at [phone] with questions or concerns regarding your invoice.   Our billing staff will not be able to assist you with questions regarding bills from these companies.  You will be contacted with the lab results as soon as they are available. The fastest way to get your results is to activate your My Chart account. Instructions are located on the last page of this paperwork. If you have not heard from [place] regarding the results in 2 weeks, please contact this office.      General Headache Without Cause A headache is pain or discomfort that is felt around the head or neck area. There are many causes and types of headaches. In some cases, the cause may not be found. Follow these instructions at home: Watch your condition for any changes. Let your doctor know about them. Take these steps to help with your condition: Managing pain      Take over-the-counter and prescription medicines only as told by your doctor.  Lie down in a dark, quiet room when you have a headache.  If told, put ice on your head and neck area: ? Put ice in a plastic bag. ? Place a towel between your skin and the bag. ? Leave the ice on for 20 minutes, 2-3 times per day.  If told, put heat on the affected area. Use the heat source that your doctor recommends, such as a moist heat pack or a heating pad. ? Place a towel between your skin and the heat source. ? Leave the heat on for 20-30 minutes. ? Remove  the heat if your skin turns bright red. This is very important if you are unable to feel pain, heat, or cold. You may have a greater risk of getting burned.  Keep lights dim if bright lights bother you or make your headaches worse. Eating and drinking  Eat meals on a regular schedule.  If you drink alcohol: ? Limit how much you use to:  0-1 drink a day for women.  0-2 drinks a day for men. ? Be aware of how much alcohol is in your drink. In the [place] one drink equals one 12 oz bottle of beer (355 mL), one 5 oz glass of wine (148 mL), or one 1 oz glass of hard liquor (44 mL).  Stop drinking caffeine, or reduce how much caffeine you drink. General instructions   Keep a journal to find out if certain things bring on headaches. For example, write down: ? What you eat and drink. ? How much sleep you get. ? Any change to your diet or medicines.  Get a massage or try other ways to relax.  Limit stress.  Sit up straight. Do not tighten (tense) your muscles.  Do not use any products that contain nicotine or tobacco. This includes cigarettes, e-cigarettes, and chewing tobacco. If you need help quitting, ask your doctor.  Exercise regularly as told by your doctor.  Get enough sleep.  This often means 7-9 hours of sleep each night.  Keep all follow-up visits as told by your doctor. This is important. Contact a doctor if:  Your symptoms are not helped by medicine.  You have a headache that feels different than the other headaches.  You feel sick to your stomach (nauseous) or you throw up (vomit).  You have a fever. Get help right away if:  Your headache gets very bad quickly.  Your headache gets worse after a lot of physical activity.  You keep throwing up.  You have a stiff neck.  You have trouble seeing.  You have trouble speaking.  You have pain in the eye or ear.  Your muscles are weak or you lose muscle control.  You lose your balance or have trouble  walking.  You feel like you will pass out (faint) or you pass out.  You are mixed up (confused).  You have a seizure. Summary  A headache is pain or discomfort that is felt around the head or neck area.  There are many causes and types of headaches. In some cases, the cause may not be found.  Keep a journal to help find out what causes your headaches. Watch your condition for any changes. Let your doctor know about them.  Contact a doctor if you have a headache that is different from usual, or if your headache is not helped by medicine.  Get help right away if your headache gets very bad, you throw up, you have trouble seeing, you lose your balance, or you have a seizure. This information is not intended to replace advice given to you by your health care provider. Make sure you discuss any questions you have with your health care provider. Document Released: 03/02/2008 Document Revised: 12/12/2017 Document Reviewed: 12/12/2017 Elsevier Patient Education  2020 [employer].

## 2018-12-19 NOTE — Addendum Note (Signed)
Addended by: Ileana Roup on: 12/19/2018 03:55 PM   Modules accepted: Orders

## 2018-12-19 NOTE — Progress Notes (Signed)
Amy Gaines 33 y.o.   Chief Complaint  Patient presents with  . pain behind head  . both hands tingling    HISTORY OF PRESENT ILLNESS: This is a 33 y.o. female complaining of general discomfort to posterior part of her head for the past 2 to 3 weeks associated with numbness and tingling to both arms and hands, left foot numbness, and occasional tremors on the right side of her mouth.  Daily discomforts requiring use of Tylenol daily for the past 2 to 3 weeks.  Also complaining of generalized fatigue for the same amount of time.  Feels debilitated.  Healthy woman with a healthy lifestyle, non-smoker, and no EtOH user.  No significant past medical history.  Denies syncope or gait problems.  No recent visual changes.  No seizure activity.  HPI   Prior to Admission medications   Medication Sig Start Date End Date Taking? Authorizing Provider  albuterol (PROVENTIL HFA;VENTOLIN HFA) 108 (90 Base) MCG/ACT inhaler Inhale 2 puffs into the lungs every 6 (six) hours as needed for wheezing or shortness of breath. OK TO DISPENSE PREFERRED PRO AIR OR VENTOLIN 09/19/18  Yes Corum, Rex Kras, MD    No Known Allergies  Patient Active Problem List   Diagnosis Date Noted  . Breast lump on left side at 4 o'clock position 07/11/2012  . Breast lump on right side at 5 o'clock position 07/11/2012    Past Medical History:  Diagnosis Date  . Anemia   . Asthma     Past Surgical History:  Procedure Laterality Date  . lump removed from left axilla  2007    Social History   Socioeconomic History  . Marital status: Single    Spouse name: Not on file  . Number of children: Not on file  . Years of education: Not on file  . Highest education level: Not on file  Occupational History  . Not on file  Social Needs  . Financial resource strain: Not on file  . Food insecurity    Worry: Not on file    Inability: Not on file  . Transportation needs    Medical: Not on file    Non-medical: Not on  file  Tobacco Use  . Smoking status: Never Smoker  . Smokeless tobacco: Never Used  Substance and Sexual Activity  . Alcohol use: No  . Drug use: No  . Sexual activity: Yes    Birth control/protection: Condom  Lifestyle  . Physical activity    Days per week: Not on file    Minutes per session: Not on file  . Stress: Not on file  Relationships  . Social Herbalist on phone: Not on file    Gets together: Not on file    Attends religious service: Not on file    Active member of club or organization: Not on file    Attends meetings of clubs or organizations: Not on file    Relationship status: Not on file  . Intimate partner violence    Fear of current or ex partner: Not on file    Emotionally abused: Not on file    Physically abused: Not on file    Forced sexual activity: Not on file  Other Topics Concern  . Not on file  Social History Narrative  . Not on file    Family History  Problem Relation Age of Onset  . Diabetes Mother   . Heart disease Mother   . Hypertension Mother   .  Cancer Maternal Grandmother        cervical  . Diabetes Maternal Grandmother   . Heart disease Maternal Grandmother      Review of Systems  Constitutional: Positive for malaise/fatigue. Negative for chills and fever.  HENT: Negative.  Negative for congestion, hearing loss, nosebleeds and sore throat.   Eyes: Negative.  Negative for blurred vision and double vision.  Respiratory: Negative.  Negative for cough and shortness of breath.   Cardiovascular: Negative.  Negative for chest pain and palpitations.  Gastrointestinal: Negative.  Negative for abdominal pain, diarrhea, nausea and vomiting.  Genitourinary: Negative.   Musculoskeletal: Negative.  Negative for neck pain.  Skin: Negative.  Negative for rash.  Neurological: Positive for tingling, tremors, sensory change, weakness and headaches. Negative for dizziness, speech change, focal weakness, seizures and loss of consciousness.   Psychiatric/Behavioral: Negative for depression.  All other systems reviewed and are negative.    Vitals:   12/19/18 1421  BP: 137/74  Pulse: 88  Temp: 98.1 F (36.7 C)  SpO2: 96%     Physical Exam Vitals signs reviewed.  Constitutional:      Appearance: Normal appearance.  HENT:     Head: Normocephalic and atraumatic.     Nose: Nose normal.     Mouth/Throat:     Mouth: Mucous membranes are moist.     Pharynx: Oropharynx is clear.  Eyes:     Extraocular Movements: Extraocular movements intact.     Conjunctiva/sclera: Conjunctivae normal.     Pupils: Pupils are equal, round, and reactive to light.  Neck:     Musculoskeletal: Normal range of motion and neck supple. No neck rigidity or muscular tenderness.     Vascular: No carotid bruit.  Cardiovascular:     Rate and Rhythm: Normal rate and regular rhythm.     Pulses: Normal pulses.     Heart sounds: Normal heart sounds.  Pulmonary:     Effort: Pulmonary effort is normal.     Breath sounds: Normal breath sounds.  Abdominal:     Palpations: Abdomen is soft.     Tenderness: There is no abdominal tenderness.  Musculoskeletal: Normal range of motion.     Right lower leg: No edema.     Left lower leg: No edema.  Lymphadenopathy:     Cervical: No cervical adenopathy.  Skin:    General: Skin is warm and dry.     Capillary Refill: Capillary refill takes less than 2 seconds.  Neurological:     Mental Status: She is alert.     Cranial Nerves: No cranial nerve deficit.     Sensory: Sensory deficit (Distal upper extremities) present.     Motor: No weakness.     Coordination: Coordination normal.     Gait: Gait normal.  Psychiatric:        Mood and Affect: Mood normal.        Behavior: Behavior normal.      ASSESSMENT & PLAN: [name] AspCindy was seen today for pain behind head and both hands tingling.  Diagnoses and all orders for this visit:  General weakness -     CBC with Differential/Platelet -     Comprehensive  metabolic panel -     Hemoglobin A1c -     ANA,IFA RA Diag Pnl w/rflx Tit/Patn -     Sedimentation Rate  New daily persistent headache -     MR Brain W Wo Contrast; Future -     Sedimentation Rate  Patient Instructions       If you have lab work done today you will be contacted with your lab results within the next 2 weeks.  If you have not heard from us then please contact us. The fastest way to get your results is to register for My Chart.   IF you received an x-ray today, you will receive an invoice from [facility]. Please contact [facility] at [phone] with questions or concerns regarding your invoice.   IF you received labwork today, you will receive an invoice from [facility]. Please contact [facility] at [phone] with questions or concerns regarding your invoice.   Our billing staff will not be able to assist you with questions regarding bills from these companies.  You will be contacted with the lab results as soon as they are available. The fastest way to get your results is to activate your My Chart account. Instructions are located on the last page of this paperwork. If you have not heard from us regarding the results in 2 weeks, please contact this office.      General Headache Without Cause A headache is pain or discomfort that is felt around the head or neck area. There are many causes and types of headaches. In some cases, the cause may not be found. Follow these instructions at home: Watch your condition for any changes. Let your doctor know about them. Take these steps to help with your condition: Managing pain      Take over-the-counter and prescription medicines only as told by your doctor.  Lie down in a dark, quiet room when you have a headache.  If told, put ice on your head and neck area: ? Put ice in a plastic bag. ? Place a towel between your skin and the bag. ? Leave the ice on for 20 minutes, 2-3 times per day.  If  told, put heat on the affected area. Use the heat source that your doctor recommends, such as a moist heat pack or a heating pad. ? Place a towel between your skin and the heat source. ? Leave the heat on for 20-30 minutes. ? Remove the heat if your skin turns bright red. This is very important if you are unable to feel pain, heat, or cold. You may have a greater risk of getting burned.  Keep lights dim if bright lights bother you or make your headaches worse. Eating and drinking  Eat meals on a regular schedule.  If you drink alcohol: ? Limit how much you use to:  0-1 drink a day for women.  0-2 drinks a day for men. ? Be aware of how much alcohol is in your drink. In the [place] one drink equals one 12 oz bottle of beer (355 mL), one 5 oz glass of wine (148 mL), or one 1 oz glass of hard liquor (44 mL).  Stop drinking caffeine, or reduce how much caffeine you drink. General instructions   Keep a journal to find out if certain things bring on headaches. For example, write down: ? What you eat and drink. ? How much sleep you get. ? Any change to your diet or medicines.  Get a massage or try other ways to relax.  Limit stress.  Sit up straight. Do not tighten (tense) your muscles.  Do not use any products that contain nicotine or tobacco. This includes cigarettes, e-cigarettes, and chewing tobacco. If you need help quitting, ask your doctor.  Exercise regularly as told by your  doctor.  Get enough sleep. This often means 7-9 hours of sleep each night.  Keep all follow-up visits as told by your doctor. This is important. Contact a doctor if:  Your symptoms are not helped by medicine.  You have a headache that feels different than the other headaches.  You feel sick to your stomach (nauseous) or you throw up (vomit).  You have a fever. Get help right away if:  Your headache gets very bad quickly.  Your headache gets worse after a lot of physical activity.  You keep  throwing up.  You have a stiff neck.  You have trouble seeing.  You have trouble speaking.  You have pain in the eye or ear.  Your muscles are weak or you lose muscle control.  You lose your balance or have trouble walking.  You feel like you will pass out (faint) or you pass out.  You are mixed up (confused).  You have a seizure. Summary  A headache is pain or discomfort that is felt around the head or neck area.  There are many causes and types of headaches. In some cases, the cause may not be found.  Keep a journal to help find out what causes your headaches. Watch your condition for any changes. Let your doctor know about them.  Contact a doctor if you have a headache that is different from usual, or if your headache is not helped by medicine.  Get help right away if your headache gets very bad, you throw up, you have trouble seeing, you lose your balance, or you have a seizure. This information is not intended to replace advice given to you by your health care provider. Make sure you discuss any questions you have with your health care provider. Document Released: 03/02/2008 Document Revised: 12/12/2017 Document Reviewed: 12/12/2017 Elsevier Patient Education  2020 [employer]       Edwina BarthMiguel Yakir Wenke, MD [facility]

## 2018-12-20 LAB — COMPREHENSIVE METABOLIC PANEL
ALT: 11 IU/L (ref 0–32)
AST: 19 IU/L (ref 0–40)
Albumin/Globulin Ratio: 2.3 — ABNORMAL HIGH (ref 1.2–2.2)
Albumin: 4.9 g/dL — ABNORMAL HIGH (ref 3.8–4.8)
Alkaline Phosphatase: 69 IU/L (ref 39–117)
BUN/Creatinine Ratio: 12 (ref 9–23)
BUN: 9 mg/dL (ref 6–20)
Bilirubin Total: <0.2 mg/dL (ref 0.0–1.2)
CO2: 22 mmol/L (ref 20–29)
Calcium: 9.8 mg/dL (ref 8.7–10.2)
Chloride: 103 mmol/L (ref 96–106)
Creatinine, Ser: 0.73 mg/dL (ref 0.57–1.00)
GFR calc Af Amer: 123 mL/min/{1.73_m2} (ref >59)
GFR calc non Af Amer: 107 mL/min/{1.73_m2} (ref >59)
Globulin, Total: 2.1 g/dL (ref 1.5–4.5)
Glucose: 115 mg/dL — ABNORMAL HIGH (ref 65–99)
Potassium: 3.9 mmol/L (ref 3.5–5.2)
Sodium: 142 mmol/L (ref 134–144)
Total Protein: 7 g/dL (ref 6.0–8.5)

## 2018-12-20 NOTE — Addendum Note (Signed)
Addended by: Davina Poke on: 12/20/2018 02:48 PM   Modules accepted: Orders

## 2018-12-21 LAB — ANA,IFA RA DIAG PNL W/RFLX TIT/PATN
ANA Titer 1: NEGATIVE
Cyclic Citrullin Peptide Ab: 26 units — ABNORMAL HIGH (ref 0–19)
Rhuematoid fact SerPl-aCnc: <10 IU/mL (ref 0.0–13.9)

## 2018-12-21 LAB — SEDIMENTATION RATE: Sed Rate: 15 mm/hr (ref 0–32)

## 2018-12-23 ENCOUNTER — Encounter: Payer: Self-pay | Admitting: Emergency Medicine

## 2019-01-03 ENCOUNTER — Ambulatory Visit (HOSPITAL_COMMUNITY)
Admission: RE | Admit: 2019-01-03 | Discharge: 2019-01-03 | Disposition: A | Discharge status: Home / Self Care | Payer: BC Managed Care – PPO | Source: Ambulatory Visit | Attending: Emergency Medicine | Admitting: Emergency Medicine

## 2019-01-03 ENCOUNTER — Other Ambulatory Visit: Payer: Self-pay | Admitting: Emergency Medicine

## 2019-01-03 ENCOUNTER — Other Ambulatory Visit: Payer: Self-pay

## 2019-01-03 DIAGNOSIS — G4452 New daily persistent headache (NDPH): Secondary | ICD-10-CM | POA: Insufficient documentation

## 2019-01-03 DIAGNOSIS — R202 Paresthesia of skin: Secondary | ICD-10-CM | POA: Diagnosis not present

## 2019-01-03 MED ORDER — DIPHENHYDRAMINE HCL 25 MG PO CAPS
ORAL_CAPSULE | ORAL | Status: AC
  Filled 2019-01-03: qty 1

## 2019-01-03 MED ORDER — GADOBUTROL 1 MMOL/ML IV SOLN
6.0000 mL | Freq: Once | INTRAVENOUS | Status: AC | PRN
  Administered 2019-01-03: 6 mL via INTRAVENOUS

## 2019-01-03 MED ORDER — DIPHENHYDRAMINE HCL 25 MG PO CAPS: 25.0000 mg | ORAL_CAPSULE | Freq: Once | ORAL | Status: DC

## 2019-01-03 NOTE — Progress Notes (Addendum)
IR.  Received call from MRI tech stating that patient was having a possible gadolinium reaction during MRI brain imaging scan today. States that once gadolinium was administered, patient became nauseated with 1 episode of vomiting. After this, patient's face/chest/back noted to be flushed with possible hives.  Evaluated patient alongside Dr. Earleen Newport in [place]. Denies N/V at this time or itching. States that her face feels "hot". On PE, patient's face/chest/back flushed. VSS (HR 80s, BP 134/78, SpO2 ranging from 94-99%). Lungs CTA, heart regular rhythm/rate.  Administer Benadryl 25 mg x1 and monitor x 30 minutes. If symptoms/VS stable in 30 minutes, patient ok for discharge.  IR to follow.   Bea Graff Louk, PA-C 01/03/2019, 4:21 PM

## 2019-01-03 NOTE — Progress Notes (Signed)
After administration of contrast pt had N/V followed with hives and rash.  Pt was evaluated by Earley Abide and Dr. Earleen Newport at that time Benadryl was given.  Pt contacted a family member to come pick her up and drive her home.  Post imaging was not obtained but contrast was charged in the chart and allergies were updated.

## 2019-01-03 NOTE — Progress Notes (Signed)
IR.  Patient stable 30 minutes after administration of Benadryl 25 mg PO. Flushing less intense. Denies trouble breathing. VSS. Per Dr. Earleen Newport, patient ready for discharge- states her husband is coming to pick her up. Gadolinium allergy added to patient's chart. Advised patient to call 911 if she notices any difficulty with breathing.  Please call IR with questions/concerns.   Bea Graff Louk, PA-C 01/03/2019, 4:55 PM

## 2019-01-08 ENCOUNTER — Encounter: Payer: Self-pay | Admitting: Emergency Medicine

## 2019-01-09 NOTE — Telephone Encounter (Signed)
MRI read as normal.  Neck step is to follow-up with neurology.  Thanks.

## 2019-01-29 ENCOUNTER — Encounter: Payer: Self-pay | Admitting: Neurology

## 2019-01-29 ENCOUNTER — Ambulatory Visit: Payer: BC Managed Care – PPO | Admitting: Neurology

## 2019-01-29 ENCOUNTER — Other Ambulatory Visit: Payer: Self-pay

## 2019-01-29 VITALS — BP 108/78 | HR 72 | Temp 97.3°F | Ht 65.0 in | Wt 149.0 lb

## 2019-01-29 DIAGNOSIS — R5383 Other fatigue: Secondary | ICD-10-CM | POA: Diagnosis not present

## 2019-01-29 NOTE — Patient Instructions (Addendum)
Neuralgia occipital Occipital Neuralgia  La neuralgia occipital es un tipo de dolor de cabeza que causa episodios breves de dolor muy intenso en la parte posterior de la cabeza. El dolor causado por la neuralgia occipital puede extenderse (irradiarse) a otras partes de la cabeza. Estos dolores de [place] pueden deberse a la irritacin de los nervios que emergen de la mdula espinal en la parte superior del cuello, justo por debajo de la base del crneo (nervios occipitales). Los nervios occipitales transmiten sensaciones desde la parte posterior y superior de la [place], y desde las zonas detrs de las [place]. Cules son las causas? Esta afeccin puede presentarse sin ninguna causa conocida (sndrome de cefalea primaria). En otros casos, esta afeccin se debe a la presin o a la irritacin de [employer] nervios occipitales. La compresin o la irritacin pueden deberse a lo siguiente:  Espasmos musculares en el cuello.  Lesin en el cuello.  Desgaste vertebral en el cuello (artrosis).  Enfermedad de los discos que separan las vrtebras.  Inflamacin de los vasos sanguneos que ejerce presin sobre los nervios occipitales.  Infecciones.  Tumores.  Diabetes. Cules son los signos o los sntomas? Esta afeccin causa un dolor ardiente, punzante, elctrico, estremecedor o agudo que es breve y puede expandirse a la parte superior de la cabeza. Puede suceder en un lado o a ambos lados de la cabeza. Tambin puede causarle:  Dolor detrs del ojo.  Dolor que se desencadena al mover el cuello o Photographer.  Sensibilidad en el cuero cabelludo.  Molestias en la parte posterior de la cabeza entre los episodios de dolor muy intensos.  Dolor que empeora con la exposicin a luces brillantes. Cmo se diagnostica? No hay ningn estudio para diagnosticar esta afeccin. El mdico puede diagnosticar esta afeccin en base a un examen mdico y sus sntomas. Es posible que le realicen otros  estudios, como los siguientes:  Estudios de diagnstico por imgenes del cerebro y del cuello (columna vertebral cervical), como una resonancia magntica (RM) o una exploracin por tomografa computarizada (TC). Estos buscan causas de nervios pellizcados.  Aplicar presin en los nervios del cuello para tratar de Museum/gallery exhibitions officer.  Inyeccin de un anestsico en las reas del nervio occipital para observar si el dolor desaparece (diagnstico por bloqueo de nervios). Cmo se trata? El tratamiento de esta afeccin puede comenzar con medidas sencillas, como las siguientes:  Reposo.  Masajes.  Aplicar fro o calor en el rea.  Analgsicos de [employer]. Si estas medidas no funcionan, es posible que necesite otros tratamientos, incluidos los siguientes:  Medicamentos, como los siguientes: ? Antiinflamatorios en concentraciones que requieren receta mdica. ? Relajantes musculares. ? Medicamentos anticonvulsivos, que Production manager. ? Antidepresivos, que Production manager. ? Medicamentos inyectados, como medicamentos que adormecen el rea (anestesia local) y corticoesteroides.  Ablacin por radiofrecuencia pulsada. En este procedimiento se implantan cables para emitir impulsos elctricos que inhiben las seales del dolor del nervio occipital.  Ciruga para aliviar la compresin nerviosa.  Fisioterapia. Siga estas indicaciones en su casa: Manejo del Restaurant manager, fast food las actividades que le causen dolor.  Haga reposo cuando tenga una crisis de Social research officer, government.  Intente hacerse masajes suaves para Best boy.  Intente colocar una almohada diferente o cambiar de posicin al dormir.  Si se lo indican, aplique calor en la zona afectada como se lo haya indicado el mdico. Use la fuente de calor que el mdico le recomiende, [place]  una compresa de calor hmedo o una almohadilla trmica. ? Coloque una FirstEnergy Corptoalla entre la piel y la fuente de Airline pilotcalor. ? Aplique el calor durante 20 a  30minutos. ? Retire la fuente de calor si la piel se pone de color rojo brillante. Esto es muy importante si no puede Financial risk analystsentir dolor, calor o fro. Puede correr un riesgo mayor de sufrir quemaduras.  Si se lo indican, aplique hielo en la parte posterior de la cabeza y en el rea del cuello como se lo haya indicado el mdico. ? Ponga el hielo en una bolsa plstica. ? Coloque una FirstEnergy Corptoalla entre la piel y la bolsa de hielo. ? Coloque el hielo durante 20minutos, de 2a3veces por da. Instrucciones generales  Baxter Internationalome los medicamentos de venta libre y los recetados solamente como se lo haya indicado el mdico.  Evite los factores que empeoren los sntomas, como las luces brillantes.  Intente mantenerse activo. Haga ejercicios con regularidad que no le Designer, multimediacausen dolor. Pdale al mdico que le sugiera ejercicios que sean seguros para usted.  Trabaje con un fisioterapeuta para aprender los ejercicios de elongacin que puede hacer en su casa.  Adopte una buena postura.  Concurra a todas las visitas de control como se lo haya indicado el mdico. Esto es importante. Comunquese con un mdico si:  El medicamento no le hace efecto.  Tiene sntomas nuevos o sus sntomas empeoran. Solicite ayuda de inmediato si:  Siente un dolor de cabeza muy intenso que no se [place].  Tiene cambios repentinos en la visin, el equilibrio o el habla. Resumen  La neuralgia occipital es un tipo de dolor de cabeza que causa episodios breves de dolor muy intenso en la parte posterior de la cabeza.  El dolor causado por la neuralgia occipital puede extenderse (irradiarse) a otras partes de la cabeza.  El tratamiento de esta afeccin incluye descanso, masajes y [place]medicamentos. Esta informacin no tiene Theme park managercomo fin reemplazar el consejo del mdico. Asegrese de hacerle al mdico cualquier pregunta que tenga. Document Released: 03/03/2005 Document Revised: 08/06/2017 Document Reviewed: 02/28/2017 Elsevier Patient Education  2020  Elsevier Inc.  Melatonin oral solid dosage forms 3-10mg  at bedtime What is this medicine? MELATONIN (mel uh TOH nin) is a dietary supplement. It is mostly promoted to help maintain normal sleep patterns. The FDA has not approved this supplement for any medical use. This supplement may be used for other purposes; ask your health care provider or pharmacist if you have questions. This medicine may be used for other purposes; ask your health care provider or pharmacist if you have questions. COMMON BRAND NAME(S): Melatonex What should I tell my health care provider before I take this medicine? They need to know if you have any of these conditions: cancer depression or mental illness diabetes hormone problems if you often drink alcohol immune system problems liver disease lung or breathing disease, like asthma organ transplant seizure disorder an unusual or allergic reaction to melatonin, other medicines, foods, dyes, or preservatives pregnant or trying to get pregnant breast-feeding How should I use this medicine? Take this supplement by mouth with a glass of water. Do not take with food. This supplement is usually taken 1 or 2 hours before bedtime. After taking this supplement, limit your activities to those needed to prepare for bed. Some products may be chewed or dissolved in the mouth before swallowing. Some tablets or capsules must be swallowed whole; do not cut, crush or chew. Follow the directions on the package labeling, or take as directed  by your health care professional. Do not take this supplement more often than directed. Talk to your pediatrician regarding the use of this supplement in children. Special care may be needed. This supplement is not recommended for use in children without a prescription. Overdosage: If you think you have taken too much of this medicine contact a poison control center or emergency room at once. NOTE: This medicine is only for you. Do not share this  medicine with others. What if I miss a dose? If you miss taking your dose at the usual time, skip that dose. If it is almost time for your next dose, take only that dose. Do not take double or extra doses. What may interact with this medicine? Do not take this medicine with any of the following medications: fluvoxamine ramelteon tasimelteon This medicine may also interact with the following medications: alcohol caffeine carbamazepine certain antibiotics like ciprofloxacin certain medicines for depression, anxiety, or psychotic disturbances cimetidine female hormones, like estrogens and birth control pills, patches, rings, or injections methoxsalen nifedipine other medications for sleep other herbal or dietary supplements phenobarbital rifampin smoking tobacco tamoxifen warfarin This list may not describe all possible interactions. Give your health care provider a list of all the medicines, herbs, non-prescription drugs, or dietary supplements you use. Also tell them if you smoke, drink alcohol, or use illegal drugs. Some items may interact with your medicine. What should I watch for while using this medicine? See your doctor if your symptoms do not get better or if they get worse. Do not take this supplement for more than 2 weeks unless your doctor tells you to. You may get drowsy or dizzy. Do not drive, use machinery, or do anything that needs mental alertness until you know how this medicine affects you. Do not stand or sit up quickly, especially if you are an older patient. This reduces the risk of dizzy or fainting spells. Alcohol may interfere with the effect of this medicine. Avoid alcoholic drinks. After taking this medicine, you may get up out of bed and do an activity that you do not know you are doing. The next morning, you may have no memory of this. Activities include driving a car ("sleep-driving"), making and eating food, talking on the phone, sexual activity, and  sleep-walking. Serious injuries have occurred. Call your doctor right away if you find out you have done any of these activities. Do not take this medicine if you have used alcohol that evening. Do not take it if you have taken another medicine for sleep. The risk of doing these sleep-related activities is higher. Talk to your doctor before you use this supplement if you are currently being treated for an emotional, mental, or sleep problem. This medicine may interfere with your treatment. Herbal or dietary supplements are not regulated like medicines. Rigid quality control standards are not required for dietary supplements. The purity and strength of these products can vary. The safety and effect of this dietary supplement for a certain disease or illness is not well known. This product is not intended to diagnose, treat, cure or prevent any disease. The Food and Drug Administration suggests the following to help consumers protect themselves: Always read product labels and follow directions. Natural does not mean a product is safe for humans to take. Look for products that include USP after the ingredient name. This means that the manufacturer followed the standards of the [place] Pharmacopoeia. Supplements made or sold by a nationally known food or drug company are  more likely to be made under tight controls. You can write to the company for more information about how the product was made. What side effects may I notice from receiving this medicine? Side effects that you should report to your doctor or health care professional as soon as possible: allergic reactions like skin rash, itching or hives, swelling of the face, lips, or tongue breathing problems confusion depressed mood, irritable, or other changes in moods or behaviors feeling faint or lightheaded, falls increased blood pressure irregular or missed menstrual periods signs and symptoms of liver injury like dark yellow or brown urine; general  ill feeling or flu-like symptoms; light-colored stools; loss of appetite; nausea; right upper belly pain; unusually weak or tired; yellowing of the eyes or skin trouble staying awake or alert during the day unusual activities while you are still asleep like driving, eating, making phone calls unusual bleeding or bruising Side effects that usually do not require medical attention (report to your doctor or health care professional if they continue or are bothersome): dizziness drowsiness headache hot flashes nausea tiredness unusual dreams or nightmares upset stomach This list may not describe all possible side effects. Call your doctor for medical advice about side effects. You may report side effects to FDA at 1-800-FDA-1088. Where should I keep my medicine? Keep out of the reach of children. Store at room temperature or as directed on the package label. Protect from moisture. Throw away any unused supplement after the expiration date. NOTE: This sheet is a summary. It may not cover all possible information. If you have questions about this medicine, talk to your doctor, pharmacist, or health care provider.  2020 Elsevier/Gold Standard (2017-11-18 12:11:58)

## 2019-01-29 NOTE — Progress Notes (Signed)
[facility]    Provider:  Dr Jaynee Eagles Requesting Provider: Horald Pollen, * Primary Care Provider:  Horald Pollen, MD  CC:  headaches  HPI:  Amy Gaines is a 33 y.o. female here as requested by Horald Pollen, * for headaches. No significant PMHx. She is here with an interpreter. She was having moderate pain on the back of the head and her mouth is moving. Not sleeping well due to stress. Ongoing for about a minth but now improved.  She started taking zzquil 2 weeks ago but the symptoms started 4 weeks ago. She has a lot of fatigue, could be stress. She had to stop working with covid. The pain in the back of the head is gone, it was in the back of the head and it felt like burning mostly at night .She denies any leg pain. She is feeling better. The tremor is better. She purchased a sleeping pillow and she is going to a chiropractor and is feeling better. No other focal neurologic deficits, associated symptoms, inciting events or modifiable factors.  Reviewed notes, labs and imaging from outside physicians, which showed:  MRI brain showed No acute intracranial abnormalities including mass lesion or mass effect, hydrocephalus, extra-axial fluid collection, midline shift, hemorrhage, or acute infarction, large ischemic events (personally reviewed images)   I reviewed Dr. Barry Brunner notes.  She complains of general discomfort to the posterior part of her head, she was last seen in July 2020 with Dr. Mitchel Honour and at that time she was complaining 2 to 3 weeks associated with numbness and tingling to both arms and hands, left foot numbness, and occasional tremors in the right side of her mouth.  She is using Tylenol daily.  Generalized fatigue.  Feels debilitated.  Healthy lifestyle, non-smoker and no alcohol use.  No significant past medical history.  Review of Systems: Patient complains of symptoms per HPI as well as the following symptoms: fatigue,  insomnia, sleepiness Pertinent negatives and positives per HPI. All others negative.   Social History   Socioeconomic History   Marital status: Married    Spouse name: Not on file   Number of children: 2   Years of education: Not on file   Highest education level: High school graduate  Occupational History   Not on file  Social Needs   Financial resource strain: Not on file   Food insecurity    Worry: Not on file    Inability: Not on file   Transportation needs    Medical: Not on file    Non-medical: Not on file  Tobacco Use   Smoking status: Never Smoker   Smokeless tobacco: Never Used  Substance and Sexual Activity   Alcohol use: No   Drug use: No   Sexual activity: Yes    Birth control/protection: Condom  Lifestyle   Physical activity    Days per week: Not on file    Minutes per session: Not on file   Stress: Not on file  Relationships   Social connections    Talks on phone: Not on file    Gets together: Not on file    Attends religious service: Not on file    Active member of club or organization: Not on file    Attends meetings of clubs or organizations: Not on file    Relationship status: Not on file   Intimate partner violence    Fear of current or ex partner: Not on file    Emotionally  abused: Not on file    Physically abused: Not on file    Forced sexual activity: Not on file  Other Topics Concern   Not on file  Social History Narrative   Lives at home with her husband and 2 children   Right handed   Caffeine: "16 oz a day maybe"    Family History  Problem Relation Age of Onset   Diabetes Mother    Heart disease Mother    Hypertension Mother    Cancer Maternal Grandmother        cervical   Diabetes Maternal Grandmother    Heart disease Maternal Grandmother    Migraines Cousin     Past Medical History:  Diagnosis Date   Anemia    Asthma     Patient Active Problem List   Diagnosis Date Noted   Breast lump on  left side at 4 o'clock position 07/11/2012   Breast lump on right side at 5 o'clock position 07/11/2012    Past Surgical History:  Procedure Laterality Date   lump removed from left axilla  2007    Current Outpatient Medications  Medication Sig Dispense Refill   albuterol (PROVENTIL HFA;VENTOLIN HFA) 108 (90 Base) MCG/ACT inhaler Inhale 2 puffs into the lungs every 6 (six) hours as needed for wheezing or shortness of breath. OK TO DISPENSE PREFERRED PRO AIR OR VENTOLIN 1 Inhaler 3   diphenhydrAMINE HCl, Sleep, (ZZZQUIL PO) Take 30 mLs by mouth at bedtime.     No current facility-administered medications for this visit.     Allergies as of 01/29/2019 - Review Complete 01/29/2019  Allergen Reaction Noted   Gadolinium derivatives Hives, Nausea And Vomiting, and Rash 01/03/2019    Vitals: BP 108/78 (BP Location: Right Arm, Patient Position: Sitting)    Pulse 72    Temp (!) 97.3 F (36.3 C) Comment: taken by check-in staff   Ht 5\' 5"  (1.651 m)    Wt 149 lb (67.6 kg)    BMI 24.79 kg/m  Last Weight:  Wt Readings from Last 1 Encounters:  01/29/19 149 lb (67.6 kg)   Last Height:   Ht Readings from Last 1 Encounters:  01/29/19 5\' 5"  (1.651 m)     Physical exam: Exam: Gen: NAD, conversant, well nourised, obese, well groomed                     CV: RRR, no MRG. No Carotid Bruits. No peripheral edema, warm, nontender Eyes: Conjunctivae clear without exudates or hemorrhage  Neuro: Detailed Neurologic Exam  Speech:    Speech is normal; fluent and spontaneous with normal comprehension.  Cognition:    The patient is oriented to person, place, and time;     recent and remote memory intact;     language fluent;     normal attention, concentration,     fund of knowledge Cranial Nerves:    The pupils are equal, round, and reactive to light. The fundi are normal and spontaneous venous pulsations are present. Visual fields are full to finger confrontation. Extraocular movements  are intact. Trigeminal sensation is intact and the muscles of mastication are normal. The face is symmetric. The palate elevates in the midline. Hearing intact. Voice is normal. Shoulder shrug is normal. The tongue has normal motion without fasciculations.   Coordination:    Normal finger to nose and heel to shin. Normal rapid alternating movements.   Gait:    Heel-toe and tandem gait are normal.   Motor  Observation:    No asymmetry, no atrophy, and no involuntary movements noted. Tone:    Normal muscle tone.    Posture:    Posture is normal. normal erect    Strength:    Strength is V/V in the upper and lower limbs.      Sensation: intact to LT     Reflex Exam:  DTR's:    Deep tendon reflexes in the upper and lower extremities are normal bilaterally.   Toes:    The toes are downgoing bilaterally.   Clonus:    Clonus is absent.    Assessment/Plan: This is a really lovely female with no significant history here for what sounds like occipital neuralgia as well as a mouth tremor in the setting of not sleeping well and stress, fortunately now resolved.  She also reports stress and difficulty sleeping, fatigue,she is recently been taking Z quill at night, I recommended trying to use it only as needed and maybe try melatonin instead due to the long-term side effects of taking ZzzQuil.  Also discussed some stress management, been very difficult for many people recently.  For her fatigue, also improving, I will check her thyroid.  MRI of the brain was unremarkable essentially normal.  Orders Placed This Encounter  Procedures   TSH   Follow up as needed  Cc: Georgina QuintSagardia, Miguel Jose, *,    Naomie DeanAntonia Mycal Conde, MD  [facility] [address] [place] [id number]  Phone [phone] Fax [phone]

## 2019-01-30 LAB — TSH: TSH: 2.88 u[IU]/mL (ref 0.450–4.500)

## 2019-03-30 ENCOUNTER — Other Ambulatory Visit: Payer: Self-pay

## 2019-03-30 ENCOUNTER — Ambulatory Visit (INDEPENDENT_AMBULATORY_CARE_PROVIDER_SITE_OTHER): Payer: BC Managed Care – PPO | Admitting: Family Medicine

## 2019-03-30 DIAGNOSIS — Z23 Encounter for immunization: Secondary | ICD-10-CM | POA: Diagnosis not present

## 2019-04-06 ENCOUNTER — Encounter: Payer: Self-pay | Admitting: Emergency Medicine

## 2019-04-10 ENCOUNTER — Encounter: Payer: Self-pay | Admitting: Emergency Medicine

## 2019-04-10 ENCOUNTER — Other Ambulatory Visit: Payer: Self-pay

## 2019-04-10 ENCOUNTER — Ambulatory Visit: Payer: BC Managed Care – PPO | Admitting: Emergency Medicine

## 2019-04-10 VITALS — BP 113/67 | HR 73 | Temp 98.5°F | Resp 16 | Ht 65.0 in | Wt 147.4 lb

## 2019-04-10 DIAGNOSIS — M7701 Medial epicondylitis, right elbow: Secondary | ICD-10-CM | POA: Diagnosis not present

## 2019-04-10 DIAGNOSIS — M25521 Pain in right elbow: Secondary | ICD-10-CM | POA: Diagnosis not present

## 2019-04-10 MED ORDER — MELOXICAM 7.5 MG PO TABS: 7.5000 mg | ORAL_TABLET | Freq: Every day | ORAL | 0 refills | Status: DC

## 2019-04-10 NOTE — Patient Instructions (Addendum)
If you have lab work done today you will be contacted with your lab results within the next 2 weeks.  If you have not heard from [place] then please contact us. The fastest way to get your results is to register for My Chart.   IF you received an x-ray today, you will receive an invoice from [facility]. Please contact [facility] at [phone] with questions or concerns regarding your invoice.   IF you received labwork today, you will receive an invoice from [place]. Please contact [facility] at [phone] with questions or concerns regarding your invoice.   Our billing staff will not be able to assist you with questions regarding bills from these companies.  You will be contacted with the lab results as soon as they are available. The fastest way to get your results is to activate your My Chart account. Instructions are located on the last page of this paperwork. If you have not heard from [place] regarding the results in 2 weeks, please contact this office.     Codo de [employer]  El codo de [place], tambin llamado epicondilitis medial, es una afeccin que se produce por la inflamacin de las bandas fuertes de tejido (tendones) que unen los msculos del antebrazo con la parte interna del hueso del codo. Estos tendones afectan a los [employer] permiten flexionar la palma hacia la [place]. Los tendones se vuelven menos flexibles con la edad. La afeccin se llama codo de golfista porque es ms frecuente en las personas que flexionan y tuercen las muecas de forma constante, [place]. Generalmente, esta lesin es causada por movimientos repetitivos. Cules son las causas? Las causas de esta afeccin son:  Designer, jewellery, Education officer, community o torcer la mueca de forma repetida.  Agarrar objetos con las manos de forma repetida. Qu incrementa el riesgo? Es ms probable que Personnel officer se manifieste en las personas que juegan golf, bisbol o tenis. Esta lesin es  ms frecuente en las personas que tienen trabajos que requieren el uso constante de las manos, por ejemplo:  Carpinteros.  Carniceros.  Msicos.  Mecangrafos. Cules son los signos o los sntomas? Esta afeccin causa dolor en el codo que puede extenderse hasta el antebrazo y la parte superior del brazo. Los sntomas de esta afeccin incluyen:  Social research officer, government en la parte interna del codo, el antebrazo o la [place].  Prdida de fuerza para agarrar. El Music therapist al flexionar la [place]. Cmo se diagnostica? Esta afeccin se diagnostica en funcin de los sntomas, los antecedentes mdicos y un examen fsico. Durante el examen, el mdico puede:  Evaluar la fuerza para Artist.  Mover la [place] para verificar si le duele. Tambin es posible que deba someterse a una Health visitor (RM) para:  Freight forwarder.  Buscar otros problemas.  Verificar la presencia de Freight forwarder, los msculos o los tendones. Cmo se trata? El tratamiento de esta afeccin incluye lo siguiente:  Interrumpir todas las [employer] en las que deba flexionar o torcer el codo o la mueca hasta que el dolor y otros sntomas desaparezcan antes de retomar esas actividades.  Usar un dispositivo ortopdico o una frula en la mueca para limitar los movimientos que le causen sntomas.  Colocar hielo en la zona interna del codo, el antebrazo o la mueca para Best boy.  Tomar antiinflamatorios no esteroideos (AINE) o aplicarse inyecciones con corticoesteroides para Best boy y la hinchazn.  Hacer ejercicios de estiramiento, amplitud  de movimientos y fortalecimiento (fisioterapia) como se lo haya indicado el mdico. En casos poco frecuentes, la ciruga puede ser necesaria si la afeccin no mejora. Siga estas instrucciones en su casa: Si tiene un dispositivo ortopdico o una frula:  selos como se lo haya indicado el mdico.  Afljelos si los dedos de  las manos se le adormecen, siente hormigueos o se le enfran y se tornan de Research officer, trade union.  Mantngalos limpios. Control del dolor, la rigidez y la hinchazn   Si se lo indican, aplique hielo sobre la zona de la lesin. ? Ponga el hielo en una bolsa plstica. ? Coloque una [employer] piel y Copy. ? Coloque el hielo durante , 2 a 3veces por da.  Mueva los dedos con frecuencia para evitar la rigidez.  Cuando est sentado o acostado, levante (eleve) la zona lesionada por encima del nivel del corazn. Actividad  Repose el rea lesionada como se lo haya indicado el mdico.  Retome sus actividades normales segn lo indicado por el mdico. Pregntele al mdico qu actividades son seguras para usted.  Haga ejercicio como se lo haya indicado el mdico. Estilo de vida  Si la afeccin es causada por los deportes, trabaje con un entrenador para asegurarse de lo siguiente: ? Academic librarian. ? Estar usando el equipo adecuado.  Si la afeccin tiene relacin con Kathie Dike, hable con su empleador [employer] cambios que puedan [place]. Instrucciones generales  [employer] de venta libre y los recetados solamente como se lo haya indicado el mdico.  No consuma ningn producto que contenga nicotina o tabaco, como cigarrillos, cigarrillos electrnicos y tabaco de Theatre manager. Si necesita ayuda para dejar de fumar, consulte al mdico.  Concurra a todas las visitas de seguimiento como se lo haya indicado el mdico. Esto es importante. Cmo se evita?  Antes y despus de la actividad: ? Precaliente y elongue adecuadamente antes de hacer actividad fsica. ? Reljese y elongue despus de hacer actividad fsica. ? Dele al cuerpo tiempo para [employer] perodos de [place] fsica.  Durante la actividad: ? Asegrese de usar el equipo que sea apto para usted. ? Si juega al golf, haga el swing ms lento para reducir el choque en el brazo al hacer contacto con  la pelota.  Mantenga un buen estado fsico, esto incluye lo siguiente: ? [place]. ? Flexibilidad. ? Buena capacidad cardiovascular. ? Resistencia.  Haga ejercicios para fortalecer los msculos del Product manager. Comunquese con un mdico si:  El dolor no mejora o empeora.  Siente un adormecimiento en la mano. Solicite ayuda inmediatamente si:  El dolor es intenso.  No puede mover la [place]. Resumen  El codo de [place], tambin llamado epicondilitis medial, es una afeccin que se produce por la inflamacin de las bandas fuertes de tejido (tendones) que unen los msculos del antebrazo con la parte interna del hueso del codo.  Habitualmente, esta lesin es resultado del uso excesivo.  Los sntomas de esta afeccin incluyen disminucin de la fuerza de agarre y Engineer, mining en la zona interna del codo, el antebrazo o la [place].  Esta lesin se trata con reposo, hielo, medicamentos, fisioterapia y [place], segn sea necesario. Esta informacin no tiene Theme park manager el consejo del mdico. Asegrese de hacerle al mdico cualquier pregunta que tenga. Document Released: 03/10/2006 Document Revised: 05/03/2018 Document Reviewed: 05/03/2018 Elsevier Patient Education  2020 [employer].

## 2019-04-10 NOTE — Progress Notes (Signed)
Amy Gaines 33 y.o.   Chief Complaint  Patient presents with  . Elbow Pain    RIGHT  x 6 weeks radiates the right shoulder    HISTORY OF PRESENT ILLNESS: This is a 33 y.o. female complaining of right elbow pain for the past 6 weeks.  Sharp intermittent pain that waxes and wanes.  Patient has very physical work cleaning houses.  Denies direct injury.  No associated symptoms.  Pain sometimes radiates up into the proximal arm.  HPI   Prior to Admission medications   Medication Sig Start Date End Date Taking? Authorizing Provider  albuterol (PROVENTIL HFA;VENTOLIN HFA) 108 (90 Base) MCG/ACT inhaler Inhale 2 puffs into the lungs every 6 (six) hours as needed for wheezing or shortness of breath. OK TO DISPENSE PREFERRED PRO AIR OR VENTOLIN 09/19/18  Yes Corum, Minerva FesterLisa L, MD  Ibuprofen 200 MG CAPS Take 200 mg by mouth as needed.   Yes [provider]  diphenhydrAMINE HCl, Sleep, (ZZZQUIL PO) Take 30 mLs by mouth at bedtime.    [provider]    Allergies  Allergen Reactions  . Gadolinium Derivatives Hives, Nausea And Vomiting and Rash    Pt had N/V followed with hives/rash    There are no active problems to display for this patient.   Past Medical History:  Diagnosis Date  . Anemia   . Asthma     Past Surgical History:  Procedure Laterality Date  . lump removed from left axilla  2007    Social History   Socioeconomic History  . Marital status: Married    Spouse name: Not on file  . Number of children: 2  . Years of education: Not on file  . Highest education level: High school graduate  Occupational History  . Not on file  Social Needs  . Financial resource strain: Not on file  . Food insecurity    Worry: Not on file    Inability: Not on file  . Transportation needs    Medical: Not on file    Non-medical: Not on file  Tobacco Use  . Smoking status: Never Smoker  . Smokeless tobacco: Never Used  Substance and Sexual Activity  . Alcohol  use: No  . Drug use: No  . Sexual activity: Yes    Birth control/protection: Condom  Lifestyle  . Physical activity    Days per week: Not on file    Minutes per session: Not on file  . Stress: Not on file  Relationships  . Social Musicianconnections    Talks on phone: Not on file    Gets together: Not on file    Attends religious service: Not on file    Active member of club or organization: Not on file    Attends meetings of clubs or organizations: Not on file    Relationship status: Not on file  . Intimate partner violence    Fear of current or ex partner: Not on file    Emotionally abused: Not on file    Physically abused: Not on file    Forced sexual activity: Not on file  Other Topics Concern  . Not on file  Social History Narrative   Lives at home with her husband and 2 children   Right handed   Caffeine: "16 oz a day maybe"    Family History  Problem Relation Age of Onset  . Diabetes Mother   . Heart disease Mother   . Hypertension Mother   .  Cancer Maternal Grandmother        cervical  . Diabetes Maternal Grandmother   . Heart disease Maternal Grandmother   . Migraines Cousin      Review of Systems  Constitutional: Negative.  Negative for chills and fever.  HENT: Negative.  Negative for congestion and sore throat.   Respiratory: Negative.  Negative for cough and shortness of breath.   Cardiovascular: Negative.  Negative for chest pain and palpitations.  Gastrointestinal: Negative.  Negative for abdominal pain, diarrhea, nausea and vomiting.  Genitourinary: Negative for hematuria.  Musculoskeletal: Positive for joint pain (Right elbow).  Skin: Negative.  Negative for rash.  Neurological: Negative for dizziness, sensory change, focal weakness and headaches.  All other systems reviewed and are negative.    Physical Exam Vitals signs reviewed.  Constitutional:      Appearance: Normal appearance.  HENT:     Head: Normocephalic.  Eyes:     Extraocular  Movements: Extraocular movements intact.  Neck:     Musculoskeletal: Normal range of motion.  Cardiovascular:     Rate and Rhythm: Normal rate.  Pulmonary:     Effort: Pulmonary effort is normal.  Musculoskeletal:     Comments: Right elbow: Positive tenderness at medial epicondyle.  Full range of motion.  No erythema or bruising. Right shoulder: Within normal limits. Right wrist: Within normal limits and full range of motion. Right hand: Neurovascularly intact and within normal limits.  Skin:    General: Skin is warm and dry.     Capillary Refill: Capillary refill takes less than 2 seconds.  Neurological:     General: No focal deficit present.     Mental Status: She is alert and oriented to person, place, and time.     Sensory: No sensory deficit.     Motor: No weakness.  Psychiatric:        Mood and Affect: Mood normal.        Behavior: Behavior normal.      ASSESSMENT & PLAN: Amy Gaines was seen today for elbow pain.  Diagnoses and all orders for this visit:  Right elbow pain -     meloxicam (MOBIC) 7.5 MG tablet; Take 1 tablet (7.5 mg total) by mouth daily.  Medial epicondylitis, right elbow    Patient Instructions       If you have lab work done today you will be contacted with your lab results within the next 2 weeks.  If you have not heard from [place] then please contact us. The fastest way to get your results is to register for My Chart.   IF you received an x-ray today, you will receive an invoice from [facility]. Please contact [facility] at [phone] with questions or concerns regarding your invoice.   IF you received labwork today, you will receive an invoice from [place]. Please contact [facility] at [phone] with questions or concerns regarding your invoice.   Our billing staff will not be able to assist you with questions regarding bills from these companies.  You will be contacted with the lab results as soon as they are available.  The fastest way to get your results is to activate your My Chart account. Instructions are located on the last page of this paperwork. If you have not heard from [place] regarding the results in 2 weeks, please contact this office.     Codo de [employer]  El codo de [place], tambin llamado epicondilitis medial, es una afeccin que se produce por  la inflamacin de las bandas fuertes de tejido (tendones) que unen los msculos del antebrazo con la parte interna del hueso del codo. Estos tendones afectan a los [employer] permiten flexionar la palma hacia la [place]. Los tendones se vuelven menos flexibles con la edad. La afeccin se llama codo de golfista porque es ms frecuente en las personas que flexionan y tuercen las muecas de forma constante, [place]. Generalmente, esta lesin es causada por movimientos repetitivos. Cules son las causas? Las causas de esta afeccin son:  Designer, jewellery, Education officer, community o torcer la mueca de forma repetida.  Agarrar objetos con las manos de forma repetida. Qu incrementa el riesgo? Es ms probable que Personnel officer se manifieste en las personas que juegan golf, bisbol o tenis. Esta lesin es ms frecuente en las personas que tienen trabajos que requieren el uso constante de las manos, por ejemplo:  Carpinteros.  Carniceros.  Msicos.  Mecangrafos. Cules son los signos o los sntomas? Esta afeccin causa dolor en el codo que puede extenderse hasta el antebrazo y la parte superior del brazo. Los sntomas de esta afeccin incluyen:  Social research officer, government en la parte interna del codo, el antebrazo o la [place].  Prdida de fuerza para agarrar. El Music therapist al flexionar la [place]. Cmo se diagnostica? Esta afeccin se diagnostica en funcin de los sntomas, los antecedentes mdicos y un examen fsico. Durante el examen, el mdico puede:  Evaluar la fuerza para Artist.  Mover la [place] para verificar si le duele. Tambin es  posible que deba someterse a una Health visitor (RM) para:  Freight forwarder.  Buscar otros problemas.  Verificar la presencia de Freight forwarder, los msculos o los tendones. Cmo se trata? El tratamiento de esta afeccin incluye lo siguiente:  Interrumpir todas las [employer] en las que deba flexionar o torcer el codo o la mueca hasta que el dolor y otros sntomas desaparezcan antes de retomar esas actividades.  Usar un dispositivo ortopdico o una frula en la mueca para limitar los movimientos que le causen sntomas.  Colocar hielo en la zona interna del codo, el antebrazo o la mueca para Best boy.  Tomar antiinflamatorios no esteroideos (AINE) o aplicarse inyecciones con corticoesteroides para Best boy y la hinchazn.  Hacer ejercicios de estiramiento, amplitud de movimientos y fortalecimiento (fisioterapia) como se lo haya indicado el mdico. En casos poco frecuentes, la ciruga puede ser necesaria si la afeccin no mejora. Siga estas instrucciones en su casa: Si tiene un dispositivo ortopdico o una frula:  selos como se lo haya indicado el mdico.  Afljelos si los dedos de las manos se le adormecen, siente hormigueos o se le enfran y se tornan de Optician, dispensing.  Mantngalos limpios. Control del dolor, la rigidez y la hinchazn   Si se lo indican, aplique hielo sobre la zona de la lesin. ? Ponga el hielo en una bolsa plstica. ? Coloque una [employer] piel y Therapist, nutritional. ? Coloque el hielo durante 35minutos, 2 a 3veces por da.  Mueva los dedos con frecuencia para evitar la rigidez.  Cuando est sentado o acostado, levante (eleve) la zona lesionada por encima del nivel del corazn. Actividad  Repose el rea lesionada como se lo haya indicado el mdico.  Retome sus actividades normales segn lo indicado por el mdico. Pregntele al mdico qu actividades son seguras para usted.  Haga ejercicio como se lo haya  indicado el mdico. Estilo de vida  Si la  afeccin es causada por los deportes, trabaje con un entrenador para asegurarse de lo siguiente: ? Academic librarian. ? Estar usando el equipo adecuado.  Si la afeccin tiene relacin con Kathie Dike, hable con su empleador [employer] cambios que puedan [place]. Instrucciones generales  [employer] de venta libre y los recetados solamente como se lo haya indicado el mdico.  No consuma ningn producto que contenga nicotina o tabaco, como cigarrillos, cigarrillos electrnicos y tabaco de Theatre manager. Si necesita ayuda para dejar de fumar, consulte al mdico.  Concurra a todas las visitas de seguimiento como se lo haya indicado el mdico. Esto es importante. Cmo se evita?  Antes y despus de la actividad: ? Precaliente y elongue adecuadamente antes de hacer actividad fsica. ? Reljese y elongue despus de hacer actividad fsica. ? Dele al cuerpo tiempo para [employer] perodos de [place] fsica.  Durante la actividad: ? Asegrese de usar el equipo que sea apto para usted. ? Si juega al golf, haga el swing ms lento para reducir el choque en el brazo al hacer contacto con la pelota.  Mantenga un buen estado fsico, esto incluye lo siguiente: ? [place]. ? Flexibilidad. ? Buena capacidad cardiovascular. ? Resistencia.  Haga ejercicios para fortalecer los msculos del Product manager. Comunquese con un mdico si:  El dolor no mejora o empeora.  Siente un adormecimiento en la mano. Solicite ayuda inmediatamente si:  El dolor es intenso.  No puede mover la [place]. Resumen  El codo de [place], tambin llamado epicondilitis medial, es una afeccin que se produce por la inflamacin de las bandas fuertes de tejido (tendones) que unen los msculos del antebrazo con la parte interna del hueso del codo.  Habitualmente, esta lesin es resultado del uso excesivo.  Los sntomas de esta afeccin incluyen disminucin de la  fuerza de agarre y Engineer, mining en la zona interna del codo, el antebrazo o la [place].  Esta lesin se trata con reposo, hielo, medicamentos, fisioterapia y [place], segn sea necesario. Esta informacin no tiene Theme park manager el consejo del mdico. Asegrese de hacerle al mdico cualquier pregunta que tenga. Document Released: 03/10/2006 Document Revised: 05/03/2018 Document Reviewed: 05/03/2018 Elsevier Patient Education  2020 Elsevier Inc.      Edwina Barth, MD [facility]

## 2019-05-14 ENCOUNTER — Other Ambulatory Visit: Payer: Self-pay

## 2019-05-14 ENCOUNTER — Ambulatory Visit (INDEPENDENT_AMBULATORY_CARE_PROVIDER_SITE_OTHER): Payer: BC Managed Care – PPO

## 2019-05-14 ENCOUNTER — Encounter: Payer: Self-pay | Admitting: Emergency Medicine

## 2019-05-14 ENCOUNTER — Ambulatory Visit: Payer: BC Managed Care – PPO | Admitting: Emergency Medicine

## 2019-05-14 VITALS — BP 126/74 | HR 78 | Temp 98.1°F | Ht 65.0 in | Wt 145.8 lb

## 2019-05-14 DIAGNOSIS — M7711 Lateral epicondylitis, right elbow: Secondary | ICD-10-CM

## 2019-05-14 DIAGNOSIS — M25521 Pain in right elbow: Secondary | ICD-10-CM

## 2019-05-14 MED ORDER — PREDNISONE 20 MG PO TABS: 40.0000 mg | ORAL_TABLET | Freq: Every day | ORAL | 0 refills | Status: AC

## 2019-05-14 MED ORDER — TRAMADOL HCL 50 MG PO TABS: 50.0000 mg | ORAL_TABLET | Freq: Three times a day (TID) | ORAL | 0 refills | Status: AC | PRN

## 2019-05-14 NOTE — Patient Instructions (Addendum)
If you have lab work done today you will be contacted with your lab results within the next 2 weeks.  If you have not heard from [place] then please contact us. The fastest way to get your results is to register for My Chart.   IF you received an x-ray today, you will receive an invoice from [facility]. Please contact [facility] at [phone] with questions or concerns regarding your invoice.   IF you received labwork today, you will receive an invoice from [place]. Please contact [facility] at [phone] with questions or concerns regarding your invoice.   Our billing staff will not be able to assist you with questions regarding bills from these companies.  You will be contacted with the lab results as soon as they are available. The fastest way to get your results is to activate your My Chart account. Instructions are located on the last page of this paperwork. If you have not heard from [place] regarding the results in 2 weeks, please contact this office.      Codo de [place] Tennis Elbow El codo de tenista es la hinchazn (inflamacin) en la zona externa del Alen Bleacher, cerca del codo. La hinchazn afecta los tejidos que conectan el msculo al hueso (tendones). Esta afeccin puede presentarse si practica un deporte o realiza una tarea que exige el uso excesivo del codo. La causa del codo de [place] es la repeticin del mismo movimiento una y Laverda Page. El codo de [place] puede causar lo siguiente:  Dolor y sensibilidad a la palpacin en el Product manager y la parte externa del codo. Puede sentir dolor todo Museum/gallery conservator o solo cuando utiliza el brazo.  Sensacin de ardor. Esta se extiende desde el codo hasta el brazo.  Agarre dbil en la mano. Siga estas instrucciones en su casa: Actividad  Descanse el codo y la [place]. Evite las actividades que causen [place], como se lo haya indicado el mdico.  Si se lo indica el mdico, use una correa para codo para reducir la presin  sobre la zona.  Haga los ejercicios de fisioterapia como se lo hayan indicado.  Si levanta un objeto, hgalo con la palma de la mano [place] arriba. [employer], se reduce el esfuerzo en el codo. Estilo de vida  Si el codo de [place] se debe a los deportes, revise el equipo y Manufacturing engineer de lo siguiente: ? Que lo [place] correctamente. ? Que es apto para usted.  Si el codo de [place] se debe a su trabajo o al uso de una computadora, tmese descansos con frecuencia para Sports coach. Hable con su jefe de personal sobre cmo puede controlar su afeccin en el trabajo. Si tiene un dispositivo ortopdico:  selo como se lo haya indicado el mdico. Quteselo solamente como se lo haya indicado el mdico.  Afloje el dispositivo ortopdico si los dedos se le adormecen, siente hormigueos o se le enfran y se tornan de Research officer, trade union.  Mantenga limpio el dispositivo ortopdico.  Si el dispositivo ortopdico no es impermeable, consulte a su mdico si puede quitarse el dispositivo para baarse. Si debe tener puesto el dispositivo ortopdico mientras se baa: ? No deje que se moje. ? Cbralo con un envoltorio hermtico cuando tome un bao de inmersin o [place]. Instrucciones generales   Si se lo indican, aplique hielo sobre la zona dolorida: ? Nature conservation officer hielo en una bolsa plstica. ? Coloque una [employer] piel y Copy. ? Coloque el hielo  durante 20 minutos, 2 a 3 veces por da.  Tome los medicamentos de venta libre y los recetados solamente como se lo haya indicado el mdico.  Consulting civil engineer a todas las visitas de seguimiento como se lo haya indicado el mdico. Esto es importante. Comunquese con un mdico si:  El dolor no mejora con Dispensing optician.  El dolor [place].  Tiene debilidad en el antebrazo, la mano o los dedos de la [place].  No puede sentir el antebrazo, la mano o los dedos de la [place]. Resumen  El codo de tenista es la hinchazn (inflamacin) en la zona externa del Joycie Peek,  cerca del codo.  Su causa es la repeticin del mismo movimiento una y Elmon Kirschner.  Descanse el codo y la [place]. Evite las actividades que causen [place], como se lo haya indicado el mdico.  Si se lo indican, aplique hielo sobre la zona dolorida durante 20 minutos, de 2 a 3 veces por Training and development officer. Esta informacin no tiene Marine scientist el consejo del mdico. Asegrese de hacerle al mdico cualquier pregunta que tenga. Document Released: 06/26/2010 Document Revised: 03/24/2018 Document Reviewed: 03/24/2018 Elsevier Patient Education  2020 [employer].

## 2019-05-14 NOTE — Progress Notes (Signed)
Amy Gaines 33 y.o.   Chief Complaint  Patient presents with  . right elbow pain    f/u - getting worse    HISTORY OF PRESENT ILLNESS: This is a 33 y.o. female complaining of right elbow pain for several weeks.  Gaines by me on 04/10/2019 for the same and started on meloxicam 7.5 mg with little response.  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  HPI   Prior to Admission medications   Medication Sig Start Date End Date Taking? Authorizing Provider  albuterol (PROVENTIL HFA;VENTOLIN HFA) 108 (90 Base) MCG/ACT inhaler Inhale 2 puffs into the lungs every 6 (six) hours as needed for wheezing or shortness of breath. OK TO DISPENSE PREFERRED PRO AIR OR VENTOLIN 09/19/18  Yes Corum, Minerva Fester, MD  Ibuprofen 200 MG CAPS Take 200 mg by mouth as needed.   Yes [provider]    Allergies  Allergen Reactions  . Gadolinium Derivatives Hives, Nausea And Vomiting and Rash    Pt had N/V followed with hives/rash    There are no active problems to display for this patient.   Past Medical History:  Diagnosis Date  . Anemia   . Asthma     Past Surgical History:  Procedure Laterality Date  . lump removed from left axilla  2007    Social History   Socioeconomic History  . Marital status: Married    Spouse name: Not on file  . Number of children: 2  . Years of education: Not on file  . Highest education level: High school graduate  Occupational History  . Not on file  Social Needs  . Financial resource strain: Not on file  . Food insecurity    Worry: Not on file    Inability: Not on file  . Transportation needs    Medical: Not on file    Non-medical: Not on file  Tobacco Use  . Smoking status: Never Smoker  . Smokeless tobacco: Never Used  Substance and Sexual Activity  . Alcohol use: No  . Drug use: No  . Sexual activity: Yes    Birth control/protection: Condom  Lifestyle  . Physical activity    Days per week: Not on file   Minutes per session: Not on file  . Stress: Not on file  Relationships  . Social Musician on phone: Not on file    Gets together: Not on file    Attends religious service: Not on file    Active member of club or organization: Not on file    Attends meetings of clubs or organizations: Not on file    Relationship status: Not on file  . Intimate partner violence    Fear of current or ex partner: Not on file    Emotionally abused: Not on file    Physically abused: Not on file    Forced sexual activity: Not on file  Other Topics Concern  . Not on file  Social History Narrative   Lives at home with her husband and 2 children   Right handed   Caffeine: "16 oz a day maybe"    Family History  Problem Relation Age of Onset  . Diabetes Mother   . Heart disease Mother   . Hypertension Mother   . Cancer Maternal Grandmother        cervical  . Diabetes Maternal Grandmother   . Heart disease Maternal Grandmother   . Migraines Cousin  Review of Systems  Constitutional: Negative for chills and fever.  HENT: Negative for congestion and sore throat.   Respiratory: Negative.  Negative for cough and shortness of breath.   Cardiovascular: Negative.  Negative for chest pain and palpitations.  Gastrointestinal: Negative.  Negative for abdominal pain, diarrhea, nausea and vomiting.  Musculoskeletal:       Right elbow pain  Skin: Negative.  Negative for rash.  Neurological: Negative for dizziness and headaches.  All other systems reviewed and are negative.  Gaines's Vitals   05/14/19 1522  BP: 126/74  Pulse: 78  Temp: 98.1 F (36.7 C)  TempSrc: Oral  SpO2: 100%  Weight: 145 lb 12.8 oz (66.1 kg)  Height: 5\' 5"  (1.651 m)   Body mass index is 24.26 kg/m.   Physical Exam Vitals signs reviewed.  Constitutional:      Appearance: Normal appearance.  HENT:     Head: Normocephalic.  Eyes:     Extraocular Movements: Extraocular movements intact.     Pupils: Pupils  are equal, round, and reactive to light.  Neck:     Musculoskeletal: Normal range of motion and neck supple.  Cardiovascular:     Rate and Rhythm: Normal rate.  Pulmonary:     Effort: Pulmonary effort is normal.  Musculoskeletal:     Comments: Right elbow: Positive tenderness at both medial and lateral epicondyles.  Also proximal forearm tendons.  Full range of motion.  Neurovascularly intact distally.  Skin:    General: Skin is warm and dry.     Capillary Refill: Capillary refill takes less than 2 seconds.  Neurological:     General: No focal deficit present.     Mental Status: She is alert and oriented to person, place, and time.  Psychiatric:        Mood and Affect: Mood normal.        Behavior: Behavior normal.    Dg Elbow Complete Right  Result Date: 05/14/2019 CLINICAL DATA:  Right elbow pain EXAM: RIGHT ELBOW - COMPLETE 3+ VIEW COMPARISON:  None. FINDINGS: No fracture or dislocation of the right elbow. Joint spaces are well preserved. No elbow joint effusion to suggest radiographically occult fracture. Soft tissues are unremarkable. IMPRESSION: No fracture or dislocation of the right elbow. Joint spaces are well preserved. No elbow joint effusion to suggest radiographically occult fracture. Electronically Signed   By: Lauralyn PrimesAlex  Bibbey M.D.   On: 05/14/2019 15:55     ASSESSMENT & PLAN: Amy AspCindy was Gaines Gaines for right elbow pain.  Diagnoses and all orders for this visit:  Right elbow pain -     DG Elbow Complete Right; Future -     predniSONE (DELTASONE) 20 MG tablet; Take 2 tablets (40 mg total) by mouth daily with breakfast for 5 days. -     traMADol (ULTRAM) 50 MG tablet; Take 1 tablet (50 mg total) by mouth every 8 (eight) hours as needed for up to 5 days.  Lateral epicondylitis of right elbow    Patient Instructions       If you have lab work done Gaines you will be contacted with your lab results within the next 2 weeks.  If you have not heard from us then please  contact us. The fastest way to get your results is to register for My Chart.   IF you received an x-ray Gaines, you will receive an invoice [facility]. Please contact [facility] at [phone] with questions or concerns regarding your invoice.  IF you received labwork Gaines, you will receive an invoice from [facility]. Please contact [facility] at [phone] with questions or concerns regarding your invoice.   Our billing staff will not be able to assist you with questions regarding bills from these companies.  You will be contacted with the lab results as soon as they are available. The fastest way to get your results is to activate your My Chart account. Instructions are located on the last page of this paperwork. If you have not heard from [place] regarding the results in 2 weeks, please contact this office.      Codo de [place] Tennis Elbow El codo de tenista es la hinchazn (inflamacin) en la zona externa del Joycie Peek, cerca del codo. La hinchazn afecta los tejidos que conectan el msculo al hueso (tendones). Esta afeccin puede presentarse si practica un deporte o realiza una tarea que exige el uso excesivo del codo. La causa del codo de [place] es la repeticin del mismo movimiento una y Elmon Kirschner. El codo de [place] puede causar lo siguiente:  Dolor y sensibilidad a la palpacin en el Management consultant y la parte externa del codo. Puede sentir dolor todo Physiological scientist o solo cuando utiliza el brazo.  Sensacin de ardor. Esta se extiende desde el codo hasta el brazo.  Agarre dbil en la mano. Siga estas instrucciones en su casa: Actividad  Descanse el codo y la [place]. Evite las actividades que causen [place], como se lo haya indicado el mdico.  Si se lo indica el mdico, use una correa para codo para reducir la presin sobre la zona.  Haga los ejercicios de fisioterapia como se lo hayan indicado.  Si levanta un objeto, hgalo con la palma de la mano [place].  [place], se reduce el esfuerzo en el codo. Estilo de vida  Si el codo de [place] se debe a los deportes, revise el equipo y Chief Strategy Officer de lo siguiente: ? Que lo [place] correctamente. ? Que es apto para usted.  Si el codo de [place] se debe a su trabajo o al uso de una computadora, tmese descansos con frecuencia para Publishing rights manager. Hable con su jefe de personal sobre cmo puede controlar su afeccin en el trabajo. Si tiene un dispositivo ortopdico:  selo como se lo haya indicado el mdico. Quteselo solamente como se lo haya indicado el mdico.  Afloje el dispositivo ortopdico si los dedos se le adormecen, siente hormigueos o se le enfran y se tornan de Optician, dispensing.  Mantenga limpio el dispositivo ortopdico.  Si el dispositivo ortopdico no es impermeable, consulte a su mdico si puede quitarse el dispositivo para baarse. Si debe tener puesto el dispositivo ortopdico mientras se baa: ? No deje que se moje. ? Cbralo con un envoltorio hermtico cuando tome un bao de inmersin o [place]. Instrucciones generales   Si se lo indican, aplique hielo sobre la zona dolorida: ? Field seismologist hielo en una bolsa plstica. ? Coloque una [employer] piel y Therapist, nutritional. ? Coloque el hielo durante 20 minutos, 2 a 3 veces por da.  Tome los medicamentos de venta libre y los recetados solamente como se lo haya indicado el mdico.  Consulting civil engineer a todas las visitas de seguimiento como se lo haya indicado el mdico. Esto es importante. Comunquese con un mdico si:  El dolor no mejora con Dispensing optician.  El dolor [place].  Tiene debilidad en el antebrazo, la mano o los dedos de la [place].  No puede  sentir el Product manager, la mano o los dedos de la [place]. Resumen  El codo de tenista es la hinchazn (inflamacin) en la zona externa del Alen Bleacher, cerca del codo.  Su causa es la repeticin del mismo movimiento una y Laverda Page.  Descanse el codo y la [place]. Evite las actividades que causen  [place], como se lo haya indicado el mdico.  Si se lo indican, aplique hielo sobre la zona dolorida durante 20 minutos, de 2 a 3 veces por Futures trader. Esta informacin no tiene Theme park manager el consejo del mdico. Asegrese de hacerle al mdico cualquier pregunta que tenga. Document Released: 06/26/2010 Document Revised: 03/24/2018 Document Reviewed: 03/24/2018 Elsevier Patient Education  2020 Elsevier Inc.      Edwina Barth, MD [facility]

## 2019-10-20 ENCOUNTER — Other Ambulatory Visit: Payer: Self-pay | Admitting: Family Medicine

## 2019-10-20 DIAGNOSIS — J4520 Mild intermittent asthma, uncomplicated: Secondary | ICD-10-CM

## 2019-10-29 ENCOUNTER — Other Ambulatory Visit: Payer: Self-pay | Admitting: *Deleted

## 2019-10-29 DIAGNOSIS — J4520 Mild intermittent asthma, uncomplicated: Secondary | ICD-10-CM

## 2019-10-30 ENCOUNTER — Other Ambulatory Visit: Payer: Self-pay | Admitting: *Deleted

## 2019-10-30 DIAGNOSIS — J4520 Mild intermittent asthma, uncomplicated: Secondary | ICD-10-CM

## 2019-10-31 ENCOUNTER — Other Ambulatory Visit: Payer: Self-pay

## 2019-10-31 ENCOUNTER — Other Ambulatory Visit: Payer: Self-pay | Admitting: *Deleted

## 2019-10-31 DIAGNOSIS — J4520 Mild intermittent asthma, uncomplicated: Secondary | ICD-10-CM

## 2019-10-31 MED ORDER — ALBUTEROL SULFATE HFA 108 (90 BASE) MCG/ACT IN AERS: 2.0000 | INHALATION_SPRAY | Freq: Four times a day (QID) | RESPIRATORY_TRACT | 0 refills | Status: DC | PRN

## 2019-11-07 ENCOUNTER — Ambulatory Visit (INDEPENDENT_AMBULATORY_CARE_PROVIDER_SITE_OTHER): Payer: BC Managed Care – PPO | Admitting: Emergency Medicine

## 2019-11-07 ENCOUNTER — Encounter: Payer: Self-pay | Admitting: Emergency Medicine

## 2019-11-07 ENCOUNTER — Other Ambulatory Visit: Payer: Self-pay

## 2019-11-07 DIAGNOSIS — J4520 Mild intermittent asthma, uncomplicated: Secondary | ICD-10-CM | POA: Diagnosis not present

## 2019-11-07 MED ORDER — ALBUTEROL SULFATE HFA 108 (90 BASE) MCG/ACT IN AERS: 2.0000 | INHALATION_SPRAY | Freq: Four times a day (QID) | RESPIRATORY_TRACT | 3 refills | Status: DC | PRN

## 2019-11-07 NOTE — Patient Instructions (Addendum)
If you have lab work done today you will be contacted with your lab results within the next 2 weeks.  If you have not heard from [place] then please contact us. The fastest way to get your results is to register for My Chart.   IF you received an x-ray today, you will receive an invoice from [facility]. Please contact [facility] at [phone] with questions or concerns regarding your invoice.   IF you received labwork today, you will receive an invoice from [place]. Please contact [facility] at [phone] with questions or concerns regarding your invoice.   Our billing staff will not be able to assist you with questions regarding bills from these companies.  You will be contacted with the lab results as soon as they are available. The fastest way to get your results is to activate your My Chart account. Instructions are located on the last page of this paperwork. If you have not heard from [place] regarding the results in 2 weeks, please contact this office.      Asma en los adultos Asthma, Adult  El asma es una afeccin a largo plazo (crnica) en la que las vas respiratorias se Investment banker, corporate y se obstruyen. Las vas respiratorias son los conductos que van desde la Lawyer y la boca hasta los pulmones. Una persona con asma pasar por momentos en que los sntomas empeoran. Estos se conocen como ataques de asma. Pueden causar tos, sonidos agudos al respirar (sibilancias), falta de aire y dolor en el pecho. Esto puede dificultar la respiracin. No hay una cura para el asma, pero los medicamentos y los cambios en el estilo de vida pueden ayudar a Therapist, sports. Hay muchas cosas que pueden provocar un ataque de asma o intensificar los sntomas de la enfermedad (factores desencadenantes). Los factores desencadenantes comunes incluyen los siguientes:  Moho.  Polvo.  Humo del cigarrillo.  [place]as.  Cosas que causan sntomas de alergia (alrgenos). Estas incluyen escamas de la  piel de los animales (caspa) y el polen de los pastos o los rboles.  Cosas que contaminan el aire. Entre ellas, productos de limpieza del hogar, humo de lea, niebla contaminada u olores qumicos.  Aire fro, cambios climticos y el viento.  Llanto o risa intensos.  Estrs.  Ciertos medicamentos o frmacos.  Ciertos alimentos, como frutas secas, papas fritas y [place].  Infecciones tales como los resfros o la gripe.  Ciertas enfermedades o afecciones.  Ejercicio o actividades extenuantes. El asma se puede tratar con medicamentos y mantenindose alejado de los factores que desencadenan los ataques de asma. Los medicamentos pueden incluir los siguientes:  Medicamentos de control. Estos ayudan a evitar los sntomas de asma. Generalmente, se toman [employer].  Medicamentos de [place] o de rescate de accin rpida. Estos alivian los sntomas de asma rpidamente. Se utilizan cuando es necesario y proporcionan alivio a Control and instrumentation engineer.  Medicamentos para alergias si los ataques son ocasionados por alrgenos.  Medicamentos para ayudar a Event organiser de defensa (inmunitario) del organismo. Siga estas instrucciones en su casa: Cmo evitar desencadenantes en su hogar  Cambie el filtro de la calefaccin y del aire acondicionado con frecuencia.  Limite el uso de hogares o estufas a lea.  Elimine las plagas (como cucarachas, ratones) y sus excrementos.  Deseche las plantas si observa moho en ellas.  Limpie los pisos. Elimine el polvo regularmente. Use productos de limpieza que sean inodoros.  Pdale a alguien que pase la aspiradora cuando usted no  se encuentre en casa. Utilice una aspiradora con un filtro de aire de alta eficiencia (HEPA), siempre que sea posible.  Reemplace las alfombras por pisos de [place], baldosas o vinilo. Las alfombras pueden retener las escamas de la piel de los animales y [place].  Use almohadas, mantas y Psychologist, counselling.  Lave las  sbanas y las mantas todas las semanas con agua caliente. Squelas en [employer].  Mantenga su habitacin libre de desencadenantes.  Evite las [employer] y [employer] Materials ventanas cerradas cuando en el aire haya cosas que causen sntomas de [place].  Use mantas de polister o algodn.  Limpie baos y cocinas con lavandina. Si fuera posible, pdale a alguien que vuelva a pintar las paredes de estas habitaciones con Neomia Dear pintura resistente a los hongos. Aljese de las habitaciones que se estn limpiando y pintando.  Lvese las manos frecuentemente con agua y [place]. Use desinfectante para manos si no dispone de [place] y [place].  No permita que nadie fume en su casa. Instrucciones generales  Use los medicamentos de venta libre y los recetados solamente como se lo haya indicado el mdico. ? Hable con el mdico si tiene preguntas sobre cmo y cundo tomar sus medicamentos. ? Tome nota si necesita tomar sus medicamentos con ms frecuencia que lo usual.  No consuma ningn producto que contenga nicotina o tabaco, como cigarrillos y cigarrillos electrnicos. Si necesita ayuda para dejar de consumir, consulte al mdico.  Aljese del humo de otros fumadores.  Evite realizar actividades al aire libre cuando la cantidad de alrgenos sea alta y la calidad del aire sea baja.  Use un pasamontaas al hacer actividades al [place] en invierno. Este debe cubrir la nariz y la boca. [address] fros, ejerctese en interiores, de ser posible.  Entre en calor antes de hacer ejercicio. Dedique tiempo a enfriarse luego de Arts development officer fsicas.  Use un espirmetro como se lo haya indicado el mdico. El espirmetro es una herramienta que mide el funcionamiento de los pulmones.  Lleve un registro de los valores del espirmetro. [place]el[place].  Siga el plan de accin para el asma. Este es una planificacin por escrito para el control del asma y el tratamiento de los ataques.  Asegrese de recibir todas las  vacunas que el mdico le recomiende. Consulte al [employer] vacunas para la gripe y la neumona.  Concurra a todas las visitas de [address] se lo haya indicado el mdico. Esto es importante. Comunquese con un mdico si:  Tiene sibilancias, respira con dificultad o tose aun cuando toma medicamentos para prevenir ataques.  La mucosidad que expectora cuando tose (esputo) es ms espesa que lo habitual.  La mucosidad que expectora cambia de trasparente o blanco a amarillo, verde, gris o sanguinolenta.  Tiene trastornos a causa de los [employer] toma, por ejemplo: ? Erupcin cutnea. ? Picazn. ? Hinchazn. ? Dificultad para respirar.  Necesita un medicamento de alivio ms de 2 a 3veces por semana.  El valor de flujo mximo an est entre el 50 y el 79% del Arts administrator personal despus de seguir el plan de accin durante 1hora.  Tiene fiebre. Solicite ayuda inmediatamente si:  Advertising account executive y no responde al [employer] Data durante un ataque de asma.  Le falta el aire, incluso en reposo.  Le falta el aire incluso cuando hace muy poca actividad fsica.  Tiene dificultad para comer, beber o hablar.  Siente dolor u opresin en el pecho.  Tiene latidos cardacos acelerados.  Los  labios o las uas se le tornan [place].  Siente que est por desvanecerse, est mareado o se desmaya.  Su flujo mximo es menor que el 50% del Arts administrator personal.  Se siente demasiado cansado para respirar con normalidad. Resumen  El asma es una afeccin a largo plazo (crnica) en la que las vas respiratorias se Engineer, technical sales y se obstruyen. El ataque de asma puede causar dificultad para respirar.  El asma no puede curarse, pero los [employer] y los cambios en el estilo de vida lo ayudarn a Scientist, physiological enfermedad.  Asegrese de comprender cmo evitar los desencadenantes y cmo y [employer]. Esta informacin no tiene Theme park manager el consejo del mdico.  Asegrese de hacerle al mdico cualquier pregunta que tenga. Document Revised: 09/04/2018 Document Reviewed: 12/29/2016 Elsevier Patient Education  2020 [employer].

## 2019-11-07 NOTE — Progress Notes (Signed)
Amy Gaines 34 y.o.   Chief Complaint  Patient presents with  . Medication Refill    and would like to have a CPE     HISTORY OF PRESENT ILLNESS: This is a 34 y.o. female with history of asthma here for follow-up and medication refill. Using albuterol inhaler 1-3 times per month. No other complaints or medical concerns today. Needs to schedule CPE with blood work.  HPI   Prior to Admission medications   Medication Sig Start Date End Date Taking? Authorizing Provider  albuterol (VENTOLIN HFA) 108 (90 Base) MCG/ACT inhaler Inhale 2 puffs into the lungs every 6 (six) hours as needed for wheezing or shortness of breath. OK TO DISPENSE PREFERRED PRO AIR OR VENTOLIN 10/31/19   Georgina Quint, MD    Allergies  Allergen Reactions  . Gadolinium Derivatives Hives, Nausea And Vomiting and Rash    Pt had N/V followed with hives/rash    There are no problems to display for this patient.   Past Medical History:  Diagnosis Date  . Anemia   . Asthma     Past Surgical History:  Procedure Laterality Date  . lump removed from left axilla  2007    Social History   Socioeconomic History  . Marital status: Married    Spouse name: Not on file  . Number of children: 2  . Years of education: Not on file  . Highest education level: High school graduate  Occupational History  . Not on file  Tobacco Use  . Smoking status: Never Smoker  . Smokeless tobacco: Never Used  Substance and Sexual Activity  . Alcohol use: No  . Drug use: No  . Sexual activity: Yes    Birth control/protection: Condom  Other Topics Concern  . Not on file  Social History Narrative   Lives at home with her husband and 2 children   Right handed   Caffeine: "16 oz a day maybe"   Social Determinants of Health   Financial Resource Strain:   . Difficulty of Paying Living Expenses:   Food Insecurity:   . Worried About Programme researcher, broadcasting/film/video in the Last Year:   . Barista in the Last  Year:   Transportation Needs:   . Freight forwarder (Medical):   Marland Kitchen Lack of Transportation (Non-Medical):   Physical Activity:   . Days of Exercise per Week:   . Minutes of Exercise per Session:   Stress:   . Feeling of Stress :   Social Connections:   . Frequency of Communication with Friends and Family:   . Frequency of Social Gatherings with Friends and Family:   . Attends Religious Services:   . Active Member of Clubs or Organizations:   . Attends Banker Meetings:   Marland Kitchen Marital Status:   Intimate Partner Violence:   . Fear of Current or Ex-Partner:   . Emotionally Abused:   Marland Kitchen Physically Abused:   . Sexually Abused:     Family History  Problem Relation Age of Onset  . Diabetes Mother   . Heart disease Mother   . Hypertension Mother   . Cancer Maternal Grandmother        cervical  . Diabetes Maternal Grandmother   . Heart disease Maternal Grandmother   . Migraines Cousin      Review of Systems  Constitutional: Negative.  Negative for chills and fever.  HENT: Negative.  Negative for congestion and sore throat.   Respiratory:  Negative.  Negative for cough and shortness of breath.   Cardiovascular: Negative.  Negative for chest pain and palpitations.  Gastrointestinal: Negative.  Negative for abdominal pain, diarrhea, nausea and vomiting.  Genitourinary: Negative.  Negative for dysuria and hematuria.  Musculoskeletal: Negative for myalgias.  Skin: Negative.  Negative for rash.  Neurological: Negative.  Negative for dizziness and headaches.  Endo/Heme/Allergies: Negative.   All other systems reviewed and are negative.   Today's Vitals   11/07/19 1503  BP: 129/75  Pulse: 80  Temp: 98.4 F (36.9 C)  TempSrc: Temporal  SpO2: 99%  Weight: 150 lb (68 kg)  Height: 5\' 5"  (1.651 m)   Body mass index is 24.96 kg/m.  Physical Exam Vitals reviewed.  Constitutional:      Appearance: Normal appearance.  HENT:     Head: Normocephalic.  Eyes:      Extraocular Movements: Extraocular movements intact.     Pupils: Pupils are equal, round, and reactive to light.  Cardiovascular:     Rate and Rhythm: Normal rate and regular rhythm.     Pulses: Normal pulses.     Heart sounds: Normal heart sounds.  Pulmonary:     Effort: Pulmonary effort is normal.     Breath sounds: Normal breath sounds.  Musculoskeletal:        General: Normal range of motion.     Cervical back: Normal range of motion.  Skin:    General: Skin is warm and dry.  Neurological:     General: No focal deficit present.     Mental Status: She is alert and oriented to person, place, and time.  Psychiatric:        Mood and Affect: Mood normal.        Behavior: Behavior normal.      ASSESSMENT & PLAN: Amy Gaines was seen today for medication refill.  Diagnoses and all orders for this visit:  Mild intermittent asthma without complication -     albuterol (VENTOLIN HFA) 108 (90 Base) MCG/ACT inhaler; Inhale 2 puffs into the lungs every 6 (six) hours as needed for wheezing or shortness of breath. OK TO DISPENSE PREFERRED PRO AIR OR VENTOLIN    Patient Instructions       If you have lab work done today you will be contacted with your lab results within the next 2 weeks.  If you have not heard from Amy Asp then please contact us. The fastest way to get your results is to register for My Chart.   IF you received an x-ray today, you will receive an invoice from [facility]. Please contact [facility] at [phone] with questions or concerns regarding your invoice.   IF you received labwork today, you will receive an invoice from [place]. Please contact [facility] at [phone] with questions or concerns regarding your invoice.   Our billing staff will not be able to assist you with questions regarding bills from these companies.  You will be contacted with the lab results as soon as they are available. The fastest way to get your results is to activate  your My Chart account. Instructions are located on the last page of this paperwork. If you have not heard from [phone] regarding the results in 2 weeks, please contact this office.      Amy Gaines en los adultos Asthma, Adult  El Amy Gaines es una afeccin a largo plazo (crnica) en la que las vas respiratorias se [place] y se obstruyen. Las vas respiratorias son los conductos que [place] desde  la Lawyer y la boca [employer]. Una persona con Amy Gaines pasar por momentos en que los sntomas empeoran. Estos se conocen como ataques de Amy Gaines. Pueden causar tos, sonidos agudos al respirar (sibilancias), falta de aire y dolor en el pecho. Esto puede dificultar la respiracin. No hay una cura para el Amy Gaines, pero los medicamentos y los cambios en el estilo de vida pueden ayudar a Therapist, sports. Hay muchas cosas que pueden provocar un ataque de Amy Gaines o intensificar los sntomas de la enfermedad (factores desencadenantes). Los factores desencadenantes comunes incluyen los siguientes:  Moho.  Polvo.  Humo del cigarrillo.  Cucarachas.  Cosas que causan sntomas de alergia (alrgenos). Estas incluyen escamas de la piel de los animales (caspa) y el polen de los pastos o los rboles.  Cosas que contaminan el aire. Entre ellas, productos de limpieza del hogar, humo de lea, niebla contaminada u olores qumicos.  Aire fro, cambios climticos y el viento.  Llanto o risa intensos.  Estrs.  Ciertos medicamentos o frmacos.  Ciertos alimentos, como frutas secas, papas fritas y [place].  Infecciones tales como los resfros o la gripe.  Ciertas enfermedades o afecciones.  Ejercicio o actividades extenuantes. El Amy Gaines se puede tratar con medicamentos y mantenindose alejado de los factores que desencadenan los ataques de Amy Gaines. Los medicamentos pueden incluir los siguientes:  Medicamentos de control. Estos ayudan a evitar los sntomas de Amy Gaines. Generalmente, se toman [employer].  Medicamentos de [place] o de  rescate de accin rpida. Estos alivian los sntomas de Amy Gaines rpidamente. Se utilizan cuando es necesario y proporcionan alivio a Control and instrumentation engineer.  Medicamentos para alergias si los ataques son ocasionados por alrgenos.  Medicamentos para ayudar a Event organiser de defensa (inmunitario) del organismo. Siga estas instrucciones en su casa: Cmo evitar desencadenantes en su hogar  Cambie el filtro de la calefaccin y del aire acondicionado con frecuencia.  Limite el uso de hogares o estufas a lea.  Elimine las plagas (como cucarachas, ratones) y sus excrementos.  Deseche las plantas si observa moho en ellas.  Limpie los pisos. Elimine el polvo regularmente. Use productos de limpieza que sean inodoros.  Pdale a alguien que pase la aspiradora cuando usted no se encuentre en casa. Utilice una aspiradora con un filtro de aire de alta eficiencia (HEPA), siempre que sea posible.  Reemplace las alfombras por pisos de [place], baldosas o vinilo. Las alfombras pueden retener las escamas de la piel de los animales y [place].  Use almohadas, mantas y Government social research officer.  [place]ard sbanas y las mantas todas las semanas con agua caliente. Squelas en Howell Rucks.  Mantenga su habitacin libre de desencadenantes.  Evite las [employer] y [employer] ventanas cerradas cuando en el aire haya cosas que causen sntomas de [place].  Use mantas de polister o algodn.  Limpie baos y cocinas con lavandina. Si fuera posible, pdale a alguien que vuelva a pintar las paredes de estas habitaciones con Ardelia Mems pintura resistente a los hongos. Aljese de las habitaciones que se estn limpiando y pintando.  Lvese las manos frecuentemente con agua y [place]. Use desinfectante para manos si no dispone de [place] y [place].  No permita que nadie fume en su casa. Instrucciones generales  Use los medicamentos de venta libre y los recetados solamente como se lo haya indicado el mdico. ? Hable con el mdico  si tiene preguntas sobre cmo y cundo tomar sus medicamentos. ? Tome nota si necesita tomar sus medicamentos con ms frecuencia  que lo usual.  No consuma ningn producto que contenga nicotina o tabaco, como cigarrillos y cigarrillos electrnicos. Si necesita ayuda para dejar de consumir, consulte al mdico.  Aljese del humo de otros fumadores.  Evite realizar actividades al aire libre cuando la cantidad de alrgenos sea alta y la calidad del aire sea baja.  Use un pasamontaas al hacer actividades al [place]upe Dawn en invierno. Este debe cubrir la nariz y la boca. [address] fros, ejerctese en interiores, de ser posible.  Entre en calor antes de hacer ejercicio. Dedique tiempo a enfriarse luego de Arts development officer fsicas.  Use un espirmetro como se lo haya indicado el mdico. El espirmetro es una herramienta que mide el funcionamiento de los pulmones.  Lleve un registro de los valores del espirmetro. Antelos.  Siga el plan de accin para el Amy Gaines. Este es una planificacin por escrito para el control del Amy Gaines y el tratamiento de los ataques.  Asegrese de recibir todas las vacunas que el mdico le recomiende. Consulte al [employer] vacunas para la gripe y la neumona.  Concurra a todas las visitas de [address] se lo haya indicado el mdico. Esto es importante. Comunquese con un mdico si:  Tiene sibilancias, respira con dificultad o tose aun cuando toma medicamentos para prevenir ataques.  La mucosidad que expectora cuando tose (esputo) es ms espesa que lo habitual.  La mucosidad que expectora cambia de trasparente o blanco a amarillo, verde, gris o sanguinolenta.  Tiene trastornos a causa de los [employer] toma, por ejemplo: ? Erupcin cutnea. ? Picazn. ? Hinchazn. ? Dificultad para respirar.  Necesita un medicamento de alivio ms de 2 a 3veces por semana.  El valor de flujo mximo an est entre el 50 y el 79% del Arts administrator personal  despus de seguir el plan de accin durante 1hora.  Tiene fiebre. Solicite ayuda inmediatamente si:  Advertising account executive y no responde al [employer] durante un ataque de Amy Gaines.  Le falta el aire, incluso en reposo.  Le falta el aire incluso cuando hace muy poca actividad fsica.  Tiene dificultad para comer, beber o hablar.  Siente dolor u opresin en el pecho.  Tiene latidos cardacos acelerados.  Los labios o las uas se le tornan [place].  Siente que est por desvanecerse, est mareado o se desmaya.  Su flujo mximo es menor que el 50% del Arts administrator personal.  Se siente demasiado cansado para respirar con normalidad. Resumen  El Amy Gaines es una afeccin a largo plazo (crnica) en la que las vas respiratorias se Engineer, technical sales y se obstruyen. El ataque de Amy Gaines puede causar dificultad para respirar.  El Amy Gaines no puede curarse, pero los [employer] y los cambios en el estilo de vida lo ayudarn a Scientist, physiological enfermedad.  Asegrese de comprender cmo evitar los desencadenantes y cmo y [employer]. Esta informacin no tiene Theme park manager el consejo del mdico. Asegrese de hacerle al mdico cualquier pregunta que tenga. Document Revised: 09/04/2018 Document Reviewed: 12/29/2016 Elsevier Patient Education  2020 Elsevier Inc.      Edwina Barth, MD [facility]

## 2019-11-14 DIAGNOSIS — Z1151 Encounter for screening for human papillomavirus (HPV): Secondary | ICD-10-CM | POA: Diagnosis not present

## 2019-11-14 DIAGNOSIS — Z130 Encounter for screening for diseases of the blood and blood-forming organs and certain disorders involving the immune mechanism: Secondary | ICD-10-CM | POA: Diagnosis not present

## 2019-11-14 DIAGNOSIS — Z1389 Encounter for screening for other disorder: Secondary | ICD-10-CM | POA: Diagnosis not present

## 2019-11-14 DIAGNOSIS — Z01419 Encounter for gynecological examination (general) (routine) without abnormal findings: Secondary | ICD-10-CM | POA: Diagnosis not present

## 2019-11-14 DIAGNOSIS — Z124 Encounter for screening for malignant neoplasm of cervix: Secondary | ICD-10-CM | POA: Diagnosis not present

## 2019-11-14 DIAGNOSIS — R81 Glycosuria: Secondary | ICD-10-CM | POA: Diagnosis not present

## 2019-11-14 DIAGNOSIS — N760 Acute vaginitis: Secondary | ICD-10-CM | POA: Diagnosis not present

## 2019-11-14 DIAGNOSIS — Z113 Encounter for screening for infections with a predominantly sexual mode of transmission: Secondary | ICD-10-CM | POA: Diagnosis not present

## 2019-12-20 ENCOUNTER — Encounter: Payer: BC Managed Care – PPO | Admitting: Emergency Medicine

## 2020-02-15 ENCOUNTER — Ambulatory Visit (INDEPENDENT_AMBULATORY_CARE_PROVIDER_SITE_OTHER): Payer: BC Managed Care – PPO

## 2020-02-15 ENCOUNTER — Other Ambulatory Visit: Payer: Self-pay

## 2020-02-15 ENCOUNTER — Ambulatory Visit: Payer: BC Managed Care – PPO | Admitting: Family Medicine

## 2020-02-15 ENCOUNTER — Encounter: Payer: Self-pay | Admitting: Family Medicine

## 2020-02-15 VITALS — BP 123/71 | HR 77 | Temp 98.4°F | Ht 65.0 in | Wt 151.8 lb

## 2020-02-15 DIAGNOSIS — W109XXA Fall (on) (from) unspecified stairs and steps, initial encounter: Secondary | ICD-10-CM

## 2020-02-15 DIAGNOSIS — N912 Amenorrhea, unspecified: Secondary | ICD-10-CM | POA: Diagnosis not present

## 2020-02-15 DIAGNOSIS — M25561 Pain in right knee: Secondary | ICD-10-CM

## 2020-02-15 DIAGNOSIS — Z23 Encounter for immunization: Secondary | ICD-10-CM | POA: Diagnosis not present

## 2020-02-15 DIAGNOSIS — M25461 Effusion, right knee: Secondary | ICD-10-CM | POA: Diagnosis not present

## 2020-02-15 LAB — POCT URINE PREGNANCY: Preg Test, Ur: NEGATIVE

## 2020-02-15 NOTE — Patient Instructions (Addendum)
  An MRI is being ordered on you.  Continue wearing the brace.    Icing the knee frequently will be more helpful than heat at this point.  Limit activity  If you do not hear from the MRI being scheduled by early next week call out here and ask somebody to check on that.  I have requested it be done at [facility] imaging.  If the MRI is abnormal you will need to be referred to an orthopedist.  Take ibuprofen 6 to 800 mg 3 times daily or Aleve 220 mg twice daily as needed for pain.  Additional pain relief can be obtained from Tylenol 500 mg 2 pills 3 times daily as needed.   If you have lab work done today you will be contacted with your lab results within the next 2 weeks.  If you have not heard from [place] then please contact us. The fastest way to get your results is to register for My Chart.   IF you received an x-ray today, you will receive an invoice from [facility]. Please contact [facility] at [phone] with questions or concerns regarding your invoice.   IF you received labwork today, you will receive an invoice from [place]. Please contact [facility] at [phone] with questions or concerns regarding your invoice.   Our billing staff will not be able to assist you with questions regarding bills from these companies.  You will be contacted with the lab results as soon as they are available. The fastest way to get your results is to activate your My Chart account. Instructions are located on the last page of this paperwork. If you have not heard from [place] regarding the results in 2 weeks, please contact this office.

## 2020-02-15 NOTE — Progress Notes (Signed)
Patient ID: Amy Gaines, female    DOB: Jan 05, 1986  Age: 34 y.o. MRN: [id number]  Chief Complaint  Patient presents with  . right knee pain    fell last sat     Subjective:   34 year old lady who fell off some stairs 6 days ago injuring her right knee.  She got a good knee brace and has been wearing that but continues to hurt in the lateral aspect of the right knee.  She has a feeling that is going to give way.  She has been putting ice and heat to it.  Never had a prior knee injury.  Has not seen orthopedist.  She has seen some by had a chiropractic clinic for her back in the past.  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  Current allergies, medications, problem list, past/family and social histories reviewed.  Objective:  BP 123/71   Pulse 77   Temp 98.4 F (36.9 C) (Temporal)   Ht 5\' 5"  (1.651 m)   Wt 151 lb 12.8 oz (68.9 kg)   LMP 12/16/2019   SpO2 99%   BMI 25.26 kg/m   Well-developed well-nourished young lady in no acute distress.  Has a brace on her right knee.  When she tries to walk it has obvious pain in that knee.  Minimal effusion is palpable.  No gross deformities visible.  Negative drawer sign.  No medial tenderness.  Very tender right on the joint space laterally.  Stressing the joint space gets pain laterally but it did not seem to open up the joint space a lot though I did not exert a lot of force due to the pain.  Assessment & Plan:   Assessment: 1. Lateral knee pain, right   2. Fall from stairs   3. Amenorrhea       Plan: Suspect a partial lateral collateral ligament tear.  X-ray was normal except for a little effusion.  We will schedule an MRI and then referral to orthopedist.  Orders Placed This Encounter  Procedures  . DG Knee Complete 4 Views Right    Standing Status:   Future    Number of Occurrences:   1    Standing Expiration Date:   02/14/2021    Order Specific Question:   Reason for Exam (SYMPTOM  OR DIAGNOSIS  REQUIRED)    Answer:   04/16/2021 6 days ago, painful unstable feeling right lateral knee    Order Specific Question:   Is patient pregnant?    Answer:   Unknown (Please Explain)    Comments:   irregular menses    Order Specific Question:   Preferred imaging location?    Answer:   External    Order Specific Question:   Radiology Contrast Protocol - do NOT remove file path    Answer:   [email or web address]  . MR Knee Right Wo Contrast    Allergic gandolenium    Standing Status:   Future    Standing Expiration Date:   02/14/2021    Order Specific Question:   What is the patient's sedation requirement?    Answer:   No Sedation    Order Specific Question:   Does the patient have a pacemaker or implanted devices?    Answer:   No    Order Specific Question:   Preferred imaging location?    Answer:   [facility] (table limit-550lbs)    Order Specific Question:   Release to patient    Answer:  Immediate    Order Specific Question:   Call Results- Best Contact Number?    Answer:   [phone]    Order Specific Question:   Radiology Contrast Protocol - do NOT remove file path    Answer:   [email or web address]  . Flu Vaccine QUAD 36+ mos IM  . POCT urine pregnancy    No orders of the defined types were placed in this encounter.        Patient Instructions    An MRI is being ordered on you.  Continue wearing the brace.    Icing the knee frequently will be more helpful than heat at this point.  Limit activity  If you do not hear from the MRI being scheduled by early next week call out here and ask somebody to check on that.  I have requested it be done at [facility] imaging.  If the MRI is abnormal you will need to be referred to an orthopedist.  Take ibuprofen 6 to 800 mg 3 times daily or Aleve 220 mg twice daily as needed for pain.  Additional pain relief can be obtained from Tylenol 500  mg 2 pills 3 times daily as needed.   If you have lab work done today you will be contacted with your lab results within the next 2 weeks.  If you have not heard from [place] then please contact us. The fastest way to get your results is to register for My Chart.   IF you received an x-ray today, you will receive an invoice from [facility]. Please contact [facility]iology at [phone] with questions or concerns regarding your invoice.   IF you received labwork today, you will receive an invoice from [place]. Please contact [facility] at [phone] with questions or concerns regarding your invoice.   Our billing staff will not be able to assist you with questions regarding bills from these companies.  You will be contacted with the lab results as soon as they are available. The fastest way to get your results is to activate your My Chart account. Instructions are located on the last page of this paperwork. If you have not heard from [place] regarding the results in 2 weeks, please contact this office.         Return if symptoms worsen or fail to improve.   Janace Hoard, MD 02/15/2020

## 2020-02-20 ENCOUNTER — Encounter: Payer: Self-pay | Admitting: Family Medicine

## 2020-02-22 NOTE — Telephone Encounter (Signed)
Pt called about recent referral gave her address to [facility] [address] and phone number

## 2020-02-26 ENCOUNTER — Encounter: Payer: Self-pay | Admitting: Emergency Medicine

## 2020-02-26 NOTE — Telephone Encounter (Signed)
Recommend she goes to urgent care center for further evaluation and treatment of her pain.

## 2020-03-05 ENCOUNTER — Encounter: Payer: BC Managed Care – PPO | Admitting: Emergency Medicine

## 2020-03-13 ENCOUNTER — Other Ambulatory Visit: Payer: Self-pay

## 2020-03-13 ENCOUNTER — Ambulatory Visit
Admission: RE | Admit: 2020-03-13 | Discharge: 2020-03-13 | Disposition: A | Discharge status: Home / Self Care | Payer: BLUE CROSS/BLUE SHIELD | Source: Ambulatory Visit | Attending: Family Medicine | Admitting: Family Medicine

## 2020-03-13 DIAGNOSIS — W109XXA Fall (on) (from) unspecified stairs and steps, initial encounter: Secondary | ICD-10-CM

## 2020-03-13 DIAGNOSIS — M25561 Pain in right knee: Secondary | ICD-10-CM | POA: Diagnosis not present

## 2020-03-16 ENCOUNTER — Other Ambulatory Visit: Payer: Self-pay | Admitting: Family Medicine

## 2020-03-16 DIAGNOSIS — S83512A Sprain of anterior cruciate ligament of left knee, initial encounter: Secondary | ICD-10-CM

## 2020-03-16 DIAGNOSIS — S82142A Displaced bicondylar fracture of left tibia, initial encounter for closed fracture: Secondary | ICD-10-CM

## 2020-03-17 ENCOUNTER — Telehealth: Payer: Self-pay

## 2020-03-18 NOTE — Telephone Encounter (Signed)
N/a already addressed.  °

## 2020-03-24 DIAGNOSIS — S83511A Sprain of anterior cruciate ligament of right knee, initial encounter: Secondary | ICD-10-CM | POA: Diagnosis not present

## 2020-04-01 DIAGNOSIS — M6281 Muscle weakness (generalized): Secondary | ICD-10-CM | POA: Diagnosis not present

## 2020-04-01 DIAGNOSIS — R2689 Other abnormalities of gait and mobility: Secondary | ICD-10-CM | POA: Diagnosis not present

## 2020-04-01 DIAGNOSIS — M25661 Stiffness of right knee, not elsewhere classified: Secondary | ICD-10-CM | POA: Diagnosis not present

## 2020-04-01 DIAGNOSIS — M25561 Pain in right knee: Secondary | ICD-10-CM | POA: Diagnosis not present

## 2020-04-10 DIAGNOSIS — R2689 Other abnormalities of gait and mobility: Secondary | ICD-10-CM | POA: Diagnosis not present

## 2020-04-10 DIAGNOSIS — M25561 Pain in right knee: Secondary | ICD-10-CM | POA: Diagnosis not present

## 2020-04-10 DIAGNOSIS — M6281 Muscle weakness (generalized): Secondary | ICD-10-CM | POA: Diagnosis not present

## 2020-04-10 DIAGNOSIS — M25661 Stiffness of right knee, not elsewhere classified: Secondary | ICD-10-CM | POA: Diagnosis not present

## 2020-04-21 DIAGNOSIS — R2689 Other abnormalities of gait and mobility: Secondary | ICD-10-CM | POA: Diagnosis not present

## 2020-04-21 DIAGNOSIS — M25661 Stiffness of right knee, not elsewhere classified: Secondary | ICD-10-CM | POA: Diagnosis not present

## 2020-04-21 DIAGNOSIS — M25561 Pain in right knee: Secondary | ICD-10-CM | POA: Diagnosis not present

## 2020-04-21 DIAGNOSIS — M6281 Muscle weakness (generalized): Secondary | ICD-10-CM | POA: Diagnosis not present

## 2020-05-05 DIAGNOSIS — S83511A Sprain of anterior cruciate ligament of right knee, initial encounter: Secondary | ICD-10-CM | POA: Diagnosis not present

## 2020-07-14 ENCOUNTER — Ambulatory Visit: Payer: BC Managed Care – PPO | Admitting: Emergency Medicine

## 2020-10-03 DIAGNOSIS — Z13220 Encounter for screening for lipoid disorders: Secondary | ICD-10-CM | POA: Diagnosis not present

## 2020-10-03 DIAGNOSIS — K219 Gastro-esophageal reflux disease without esophagitis: Secondary | ICD-10-CM

## 2020-10-03 DIAGNOSIS — Z8249 Family history of ischemic heart disease and other diseases of the circulatory system: Secondary | ICD-10-CM | POA: Diagnosis not present

## 2020-10-03 DIAGNOSIS — Z0001 Encounter for general adult medical examination with abnormal findings: Secondary | ICD-10-CM | POA: Diagnosis not present

## 2020-10-03 DIAGNOSIS — J4520 Mild intermittent asthma, uncomplicated: Secondary | ICD-10-CM

## 2020-10-03 DIAGNOSIS — Z1329 Encounter for screening for other suspected endocrine disorder: Secondary | ICD-10-CM | POA: Diagnosis not present

## 2020-10-03 DIAGNOSIS — Z0000 Encounter for general adult medical examination without abnormal findings: Secondary | ICD-10-CM | POA: Diagnosis not present

## 2020-10-03 HISTORY — DX: Gastro-esophageal reflux disease without esophagitis: K21.9

## 2020-10-03 HISTORY — DX: Mild intermittent asthma, uncomplicated: J45.20

## 2020-12-05 DIAGNOSIS — R399 Unspecified symptoms and signs involving the genitourinary system: Secondary | ICD-10-CM | POA: Diagnosis not present

## 2021-02-17 ENCOUNTER — Ambulatory Visit (INDEPENDENT_AMBULATORY_CARE_PROVIDER_SITE_OTHER): Payer: Self-pay | Admitting: Emergency Medicine

## 2021-02-17 ENCOUNTER — Other Ambulatory Visit: Payer: Self-pay

## 2021-02-17 ENCOUNTER — Encounter: Payer: Self-pay | Admitting: Emergency Medicine

## 2021-02-17 VITALS — BP 116/60 | HR 100 | Temp 98.7°F | Ht 65.0 in | Wt 146.0 lb

## 2021-02-17 DIAGNOSIS — M62838 Other muscle spasm: Secondary | ICD-10-CM

## 2021-02-17 DIAGNOSIS — S46811A Strain of other muscles, fascia and tendons at shoulder and upper arm level, right arm, initial encounter: Secondary | ICD-10-CM

## 2021-02-17 DIAGNOSIS — R1314 Dysphagia, pharyngoesophageal phase: Secondary | ICD-10-CM

## 2021-02-17 DIAGNOSIS — M7918 Myalgia, other site: Secondary | ICD-10-CM

## 2021-02-17 MED ORDER — MELOXICAM 15 MG PO TABS: 15.0000 mg | ORAL_TABLET | Freq: Every day | ORAL | 0 refills | Status: DC

## 2021-02-17 MED ORDER — CYCLOBENZAPRINE HCL 10 MG PO TABS: 10.0000 mg | ORAL_TABLET | Freq: Three times a day (TID) | ORAL | 0 refills | Status: DC | PRN

## 2021-02-17 NOTE — Progress Notes (Signed)
Alric Ran Vari 35 y.o.   Chief Complaint  Patient presents with   Neck Pain    Right side, x 1 month.    HISTORY OF PRESENT ILLNESS: This is a 35 y.o. female complaining of constant sharp pain to right upper back and right neck for the past month. Works Education officer, environmental houses.  Very physical work.  No direct injury. No other associated symptoms. Also complaining of intermittent episodes of trouble swallowing, "like food getting stuck in my throat" for the past several months. Has a long history of chronic reflux symptoms since she was a teenager.  No other associated symptoms.  Neck Pain  Pertinent negatives include no chest pain, fever or headaches.    Prior to Admission medications   Medication Sig Start Date End Date Taking? Authorizing Provider  albuterol (VENTOLIN HFA) 108 (90 Base) MCG/ACT inhaler Inhale 2 puffs into the lungs every 6 (six) hours as needed for wheezing or shortness of breath. OK TO DISPENSE PREFERRED PRO AIR OR VENTOLIN 11/07/19  Yes Lorijean Husser, Eilleen Kempf, MD  cyclobenzaprine (FLEXERIL) 10 MG tablet Take 1 tablet (10 mg total) by mouth 3 (three) times daily as needed for muscle spasms. 02/17/21  Yes Alton Tremblay, Eilleen Kempf, MD  meloxicam (MOBIC) 15 MG tablet Take 1 tablet (15 mg total) by mouth daily. 02/17/21  Yes Shashank Kwasnik, Eilleen Kempf, MD    Allergies  Allergen Reactions   Gadolinium Derivatives Hives, Nausea And Vomiting and Rash    Pt had N/V followed with hives/rash    There are no problems to display for this patient.   Past Medical History:  Diagnosis Date   Anemia    Asthma     Past Surgical History:  Procedure Laterality Date   lump removed from left axilla  2007    Social History   Socioeconomic History   Marital status: Married    Spouse name: Not on file   Number of children: 2   Years of education: Not on file   Highest education level: High school graduate  Occupational History   Not on file  Tobacco Use   Smoking status: Never    Smokeless tobacco: Never  Vaping Use   Vaping Use: Never used  Substance and Sexual Activity   Alcohol use: No   Drug use: No   Sexual activity: Yes    Birth control/protection: Condom  Other Topics Concern   Not on file  Social History Narrative   Lives at home with her husband and 2 children   Right handed   Caffeine: "16 oz a day maybe"   Social Determinants of Corporate investment banker Strain: Not on file  Food Insecurity: Not on file  Transportation Needs: Not on file  Physical Activity: Not on file  Stress: Not on file  Social Connections: Not on file  Intimate Partner Violence: Not on file    Family History  Problem Relation Age of Onset   Diabetes Mother    Heart disease Mother    Hypertension Mother    Cancer Maternal Grandmother        cervical   Diabetes Maternal Grandmother    Heart disease Maternal Grandmother    Migraines Cousin      Review of Systems  Constitutional: Negative.  Negative for chills and fever.  HENT: Negative.  Negative for congestion and sore throat.   Respiratory: Negative.  Negative for cough and shortness of breath.   Cardiovascular: Negative.  Negative for chest pain and palpitations.  Gastrointestinal:  Negative for abdominal pain, blood in stool, diarrhea, melena, nausea and vomiting.  Genitourinary: Negative.  Negative for dysuria.  Musculoskeletal:  Positive for neck pain.  Skin: Negative.  Negative for rash.  Neurological:  Negative for dizziness and headaches.  All other systems reviewed and are negative.  Today's Vitals   02/17/21 1334  BP: 116/60  Pulse: 100  Temp: 98.7 F (37.1 C)  TempSrc: Oral  SpO2: 95%  Weight: 146 lb (66.2 kg)  Height: 5\' 5"  (1.651 m)   Body mass index is 24.3 kg/m. Wt Readings from Last 3 Encounters:  02/17/21 146 lb (66.2 kg)  02/15/20 151 lb 12.8 oz (68.9 kg)  11/07/19 150 lb (68 kg)    Physical Exam Vitals reviewed.  Constitutional:      Appearance: Normal appearance.   HENT:     Head: Normocephalic.     Right Ear: Tympanic membrane, ear canal and external ear normal.     Mouth/Throat:     Mouth: Mucous membranes are moist.     Pharynx: Oropharynx is clear. No oropharyngeal exudate or posterior oropharyngeal erythema.  Eyes:     Extraocular Movements: Extraocular movements intact.     Conjunctiva/sclera: Conjunctivae normal.     Pupils: Pupils are equal, round, and reactive to light.  Neck:     Thyroid: No thyroid tenderness.     Vascular: No carotid bruit.  Cardiovascular:     Rate and Rhythm: Normal rate and regular rhythm.  Pulmonary:     Effort: Pulmonary effort is normal.     Breath sounds: Normal breath sounds.  Musculoskeletal:     Cervical back: Pain with movement, spinous process tenderness and muscular tenderness present. Decreased range of motion.     Comments: Positive tenderness and muscle spasm to right trapezius muscle  Lymphadenopathy:     Cervical: No cervical adenopathy.  Skin:    General: Skin is warm and dry.     Capillary Refill: Capillary refill takes less than 2 seconds.  Neurological:     General: No focal deficit present.     Mental Status: She is alert and oriented to person, place, and time.     ASSESSMENT & PLAN: Pavneet was seen today for neck pain.  Diagnoses and all orders for this visit:  Strain of right trapezius muscle, initial encounter  Musculoskeletal pain -     meloxicam (MOBIC) 15 MG tablet; Take 1 tablet (15 mg total) by mouth daily.  Pharyngoesophageal dysphagia -     Ambulatory referral to Gastroenterology  Trapezius muscle spasm -     cyclobenzaprine (FLEXERIL) 10 MG tablet; Take 1 tablet (10 mg total) by mouth 3 (three) times daily as needed for muscle spasms.  Patient Instructions  Distensin muscular Muscle Strain Una distensin muscular, o estiramiento muscular, ocurre cuando un msculo se estira ms all de la longitud normal. Esta distensin puede desgarrar algunas fibras musculares  y [name] Asp. En general, la recuperacin de una distensin muscular demora entre 1 y 2 semanas. La recuperacin completa normalmente tarda de 5 a 6 semanas. Cules son las causas? Esta afeccin ocurre cuando se aplica una fuerza sbita sobre un msculo y [place] se [place]. Puede ocurrir durante cadas, mientras se levantan objetos o se practican deportes. Qu incrementa el riesgo? Es ms probable que desarrolle una distensin muscular si es atleta o realiza mucha actividad fsica. Cules son los signos o sntomas? Dolor. Sensibilidad. Moretones. Hinchazn. Dificultad cuando se Public relations account executive el msculo. Cmo se trata? Inicialmente,  se trata con terapia PRICE (proteccin, reposo, hielo, compresin, elevacin). Esto implica: Proteger al msculo para que no sufra nuevas lesiones. Dejar en reposo el msculo lesionado. Aplicar hielo en el msculo lesionado. Aplicar presin (compresin) en el msculo lesionado. Esto se puede hacer con una frula o una venda elstica. Levantar (elevar) el msculo lesionado. Adems, el mdico puede recomendarle analgsicos. Siga estas indicaciones en su casa: Si tiene una frula que se puede retirar: Use la frula como se lo haya indicado el mdico. Qutesela solamente como se lo haya indicado el mdico. Controle todos los das la piel alrededor de la frula. Comunquese con su mdico si ve algn problema. Afloje la frula si los dedos de la mano o de los pies: Hormiguean. Se adormecen. Se tornan fros y de [employer]. Mantenga la frula limpia. Si la frula no es impermeable: No deje que se moje. Cbrala con un envoltorio hermtico cuando tome un bao de inmersin o una ducha. Control del dolor, la rigidez y la hinchazn  Si se lo indican, aplique hielo sobre la zona lesionada. Para hacer esto: Si tiene una frula desmontable, qutesela como se lo haya indicado el mdico. Ponga el hielo en una bolsa plstica. Coloque una toalla entre la piel y Web designer. Aplique el hielo durante 20 minutos, 2 o 3 veces por da. Retire el hielo si la piel se le pone de color rojo brillante. Esto es [employer]. Si no puede sentir dolor, calor o fro, tiene un mayor riesgo de que se dae la zona. Mueva los dedos de las manos o de los pies con Psychologist, clinical. Cuando est sentado o acostado, mantenga la zona lesionada por encima del nivel del corazn. Use una venda elstica segn las indicaciones del mdico. Asegrese de no ajustarla demasiado. Indicaciones generales Use los medicamentos de venta libre y los recetados solamente como se lo haya indicado el mdico. Esto puede incluir: Medicamentos para Chief Technology Officer y la hinchazn que se toman por boca o se colocan en la piel. Medicamentos para Armed forces logistics/support/administrative officer. Limite las actividades. Deje en reposo el msculo lesionado como se lo haya indicado el mdico. El mdico puede decirle que los movimientos suaves no representan un problema. Si le indicaron fisioterapia, haga los ejercicios como se lo haya indicado el mdico. No ejerza presin en ninguna parte de la frula hasta que se haya endurecido por completo. Esto puede tardar muchas horas. No fume ni consuma ningn producto que contenga nicotina o tabaco. Si necesita ayuda para dejar de consumir estos productos, consulte al [employer]. Consulte al mdico cundo puede volver a conducir si tiene una frula. Concurra a todas las visitas de seguimiento. Cmo se evita? Entre en calor antes de hacer ejercicio. Esto ayuda a prevenir futuras distensiones musculares. Comunquese con un mdico si: Siente ms dolor o tiene ms hinchazn en la zona lesionada. Solicite ayuda de inmediato si: Si tiene alguno de [employer] en el lugar de la lesin: Adormecimiento. Hormigueo. Menos fuerza de lo normal. Resumen Una distensin muscular es una lesin que se produce cuando un msculo se estira ms all de la longitud normal. Inicialmente, se trata con terapia PRICE  (proteccin, reposo, hielo, compresin, elevacin). Esto implica proteger la lesin, aplicar hielo y presin, levantar el rea y dejarla en reposo. Limite las actividades. Deje en reposo el msculo lesionado como se lo haya indicado el mdico. El mdico puede decirle que los movimientos suaves no representan un problema. Entre en calor antes de hacer ejercicio. Esto ayuda a Radio producer  futuras distensiones musculares. Esta informacin no tiene Theme park manager el consejo del mdico. Asegrese de hacerle al mdico cualquier pregunta que tenga. Document Revised: 09/09/2020 Document Reviewed: 09/09/2020 Elsevier Patient Education  2022 Elsevier Inc.    Edwina Barth, MD [facility]

## 2021-02-17 NOTE — Patient Instructions (Signed)
Distensin muscular Muscle Strain Una distensin muscular, o estiramiento muscular, ocurre cuando un msculo se estira ms all de la longitud normal. Esta distensin puede desgarrar algunas fibras musculares y Programmer, multimedia. En general, la recuperacin de una distensin muscular demora entre 1 y 2 semanas. La recuperacin completa normalmente tarda de 5 a 6 semanas. Cules son las causas? Esta afeccin ocurre cuando se aplica una fuerza sbita sobre un msculo y [place] se Public relations account executive. Puede ocurrir durante cadas, mientras se levantan objetos o se practican deportes. Qu incrementa el riesgo? Es ms probable que desarrolle una distensin muscular si es atleta o realiza mucha actividad fsica. Cules son los signos o sntomas? Dolor. Sensibilidad. Moretones. Hinchazn. Dificultad cuando se [place] el msculo. Cmo se trata? Inicialmente, se trata con terapia PRICE (proteccin, reposo, hielo, compresin, elevacin). Esto implica: Proteger al msculo para que no sufra nuevas lesiones. Dejar en reposo el msculo lesionado. Aplicar hielo en el msculo lesionado. Aplicar presin (compresin) en el msculo lesionado. Esto se puede hacer con una frula o una venda elstica. Levantar (elevar) el msculo lesionado. Adems, el mdico puede recomendarle analgsicos. Siga estas indicaciones en su casa: Si tiene una frula que se puede retirar: Use la frula como se lo haya indicado el mdico. Qutesela solamente como se lo haya indicado el mdico. Controle todos los das la piel alrededor de la frula. Comunquese con su mdico si ve algn problema. Afloje la frula si los dedos de la mano o de los pies: Hormiguean. Se adormecen. Se tornan fros y de [employer]. Mantenga la frula limpia. Si la frula no es impermeable: No deje que se moje. Cbrala con un envoltorio hermtico cuando tome un bao de inmersin o una ducha. Control del dolor, la rigidez y la hinchazn  Si se lo indican,  aplique hielo sobre la zona lesionada. Para hacer esto: Si tiene una frula desmontable, qutesela como se lo haya indicado el mdico. Ponga el hielo en una bolsa plstica. Coloque una toalla entre la piel y Copy. Aplique el hielo durante 20 minutos, 2 o 3 veces por da. Retire el hielo si la piel se le pone de color rojo brillante. Esto es [employer]. Si no puede sentir dolor, calor o fro, tiene un mayor riesgo de que se dae la zona. Mueva los dedos de las manos o de los pies con Psychologist, clinical. Cuando est sentado o acostado, mantenga la zona lesionada por encima del nivel del corazn. Use una venda elstica segn las indicaciones del mdico. Asegrese de no ajustarla demasiado. Indicaciones generales Use los medicamentos de venta libre y los recetados solamente como se lo haya indicado el mdico. Esto puede incluir: Medicamentos para Chief Technology Officer y la hinchazn que se toman por boca o se colocan en la piel. Medicamentos para Armed forces logistics/support/administrative officer. Limite las actividades. Deje en reposo el msculo lesionado como se lo haya indicado el mdico. El mdico puede decirle que los movimientos suaves no representan un problema. Si le indicaron fisioterapia, haga los ejercicios como se lo haya indicado el mdico. No ejerza presin en ninguna parte de la frula hasta que se haya endurecido por completo. Esto puede tardar muchas horas. No fume ni consuma ningn producto que contenga nicotina o tabaco. Si necesita ayuda para dejar de consumir estos productos, consulte al [employer]. Consulte al mdico cundo puede volver a conducir si tiene una frula. Concurra a todas las visitas de seguimiento. Cmo se evita? Entre en calor antes de hacer ejercicio. Esto ayuda a prevenir futuras distensiones  musculares. Comunquese con un mdico si: Siente ms dolor o tiene ms hinchazn en la zona lesionada. Solicite ayuda de inmediato si: Si tiene alguno de [employer] en el lugar de la  lesin: Adormecimiento. Hormigueo. Menos fuerza de lo normal. Resumen Una distensin muscular es una lesin que se produce cuando un msculo se estira ms all de la longitud normal. Inicialmente, se trata con terapia PRICE (proteccin, reposo, hielo, compresin, elevacin). Esto implica proteger la lesin, aplicar hielo y presin, levantar el rea y dejarla en reposo. Limite las actividades. Deje en reposo el msculo lesionado como se lo haya indicado el mdico. El mdico puede decirle que los movimientos suaves no representan un problema. Entre en calor antes de hacer ejercicio. Esto ayuda a prevenir futuras distensiones musculares. Esta informacin no tiene Theme park manager el consejo del mdico. Asegrese de hacerle al mdico cualquier pregunta que tenga. Document Revised: 09/09/2020 Document Reviewed: 09/09/2020 Elsevier Patient Education  2022 [employer].

## 2021-03-03 ENCOUNTER — Encounter: Payer: Self-pay | Admitting: Gastroenterology

## 2021-03-30 ENCOUNTER — Ambulatory Visit (INDEPENDENT_AMBULATORY_CARE_PROVIDER_SITE_OTHER): Payer: 59 | Admitting: Gastroenterology

## 2021-03-30 ENCOUNTER — Encounter: Payer: Self-pay | Admitting: Gastroenterology

## 2021-03-30 VITALS — BP 100/60 | HR 84 | Ht 65.0 in | Wt 149.2 lb

## 2021-03-30 DIAGNOSIS — R1012 Left upper quadrant pain: Secondary | ICD-10-CM

## 2021-03-30 DIAGNOSIS — K219 Gastro-esophageal reflux disease without esophagitis: Secondary | ICD-10-CM

## 2021-03-30 DIAGNOSIS — R1319 Other dysphagia: Secondary | ICD-10-CM

## 2021-03-30 DIAGNOSIS — R0989 Other specified symptoms and signs involving the circulatory and respiratory systems: Secondary | ICD-10-CM

## 2021-03-30 MED ORDER — PANTOPRAZOLE SODIUM 40 MG PO TBEC: 40.0000 mg | DELAYED_RELEASE_TABLET | Freq: Every day | ORAL | 3 refills | Status: DC

## 2021-03-30 NOTE — Progress Notes (Addendum)
Referring Provider: Georgina Quint, * Primary Care Physician:  Georgina Quint, MD   Reason for Consultation: Dysphagia   IMPRESSION:  Longstanding reflux present for over 20 years Dysphagia to solids and liquids x6 months Globus Left upper quadrant postprandial abdominal pain Recent use of meloxicam for right trapezius muscle strain  Dysphagia, globus, and abdominal pain may all be related to incompletely treated reflux.  She is also at risk for peptic ulcer disease given her meloxicam use.  Given the chronicity of persistent reflux and these new symptoms I have recommended lifestyle changes, avoiding all NSAIDs, starting a proton pump inhibitor, and an upper endoscopy with biopsies to evaluate for reflux, eosinophilic esophagitis given her concurrent diagnosis of asthma, gastritis, H. pylori, peptic ulcer disease and for possible dilation.  PLAN: - Reviewed reflux lifestyle modifications (brochure provided) - Avoid all NSAIDs - Start pantoprazole 40 mg every morning - EGD with biopsies and possible dilation - Abdominal ultrasound if symptoms persist despite treatment and if endoscopy is nondiagnostic  I consented the patient discussing the risks, benefits, and alternatives to endoscopic evaluation. In particular, we discussed the risks that include, but are not limited to, reaction to medication, cardiopulmonary compromise, aspiration resulting in pneumonia, perforation.  The patient acknowledges these risks and asks that we proceed.   HPI: Amy Gaines is a 35 y.o. female referred by cardiology for dysphagia.  The history is obtained through this patient with the help of a Spanish translator and review of her electronic record.  She has a history of anemia and asthma.  She has a long history of reflux symptoms now with 6-12 months of progressive reflux, dysphagia to solids and liquids, sensation that something is stuck in the throat localized to the cervical  esophagus, bloating, distension and frequent eructation. Eructation provides some relief. Reflux developed as a teenager at age 78.  There is no dysphonia, sore throat, neck pain, nausea, or vomiting.  However, she recently had a strain of the right trapezius muscle treated with meloxicam and cyclobenzaprine.  No evidence for GI bleeding, iron deficiency anemia, anorexia, unexplained weight loss, odynophagia, persistent vomiting, or gastrointestinal cancer in a first-degree relative.   For the last 6 to 7 months she is also experienced an intermittent, postprandial, sharp, nonradiating left upper quadrant abdominal pain. The pain is located under the ribs.  No improvement with Prilosec OTC x 1 month. Avoiding lactose and greasy foods has not provided any relief.    No prior abdominal imaging.  No prior endoscopic evaluation.  Most recent labs from 12/2018 showing normal CMP and CBC except for a glucose of 115  Mother with gastritis. No family history of gastrointestinal cancer in a first-degree relative.  Maternal grandmother with cervical cancer.   Past Medical History:  Diagnosis Date   Anemia    Asthma    Hepatitis A    as a teenager    Past Surgical History:  Procedure Laterality Date   lump removed from left axilla  2007    No current outpatient medications on file.   No current facility-administered medications for this visit.    Allergies as of 03/30/2021 - Review Complete 03/30/2021  Allergen Reaction Noted   Gadolinium derivatives Hives, Nausea And Vomiting, and Rash 01/03/2019    Family History  Problem Relation Age of Onset   Diabetes Mother    Heart disease Mother    Hypertension Mother    Cancer Maternal Grandmother        cervical  Diabetes Maternal Grandmother    Heart disease Maternal Grandmother    Migraines Cousin    Colon cancer Neg Hx    Esophageal cancer Neg Hx    Pancreatic cancer Neg Hx    Stomach cancer Neg Hx    Liver disease Neg Hx       Review of Systems: 12 system ROS is negative except as noted above wit the addition of back pain, fatigue, itching, menstrual pain.   Physical Exam: General:   Alert,  well-nourished, pleasant and cooperative in NAD Head:  Normocephalic and atraumatic. Eyes:  Sclera clear, no icterus.   Conjunctiva pink. Ears:  Normal auditory acuity. Nose:  No deformity, discharge,  or lesions. Mouth:  No deformity or lesions.   Neck:  Supple; no masses or thyromegaly. Lungs:  Clear throughout to auscultation.   No wheezes. Heart:  Regular rate and rhythm; no murmurs. Abdomen:  Soft, nontender, nondistended, normal bowel sounds, no rebound or guarding. No hepatosplenomegaly.  Although I am unable to reproduce her pain, she localizes the intermittent pain to an area in the LUQ just under her ribs.  Rectal:  Deferred  Msk:  Symmetrical. No boney deformities LAD: No inguinal or umbilical LAD Extremities:  No clubbing or edema. Neurologic:  Alert and  oriented x4;  grossly nonfocal Skin:  Intact without significant lesions or rashes. Psych:  Alert and cooperative. Normal mood and affect.     Bader Stubblefield L. Orvan Falconer, MD, MPH 03/30/2021, 8:40 AM

## 2021-03-30 NOTE — Patient Instructions (Addendum)
It was a pleasure to meet you today. I am recommending that you starting taking a medication every day to treat your reflux, as this may ultimately be the cause of your pain and swallowing difficulty.   Start taking pantoprazole 40 mg every morning, ideally 30 minutes before meals.  I recommend that you avoid using all non-steroidal antiinflammatory medications.   An upper endoscopy is recommended to evaluate for infection, inflammation, and ulcers.   Modifying diet and lifestyle remains the foundation for treating the symptoms of reflux.   The following strategies help you prevent heartburn and other symptoms by avoiding foods that reduce the effectiveness of the bottom of the esophagus from protecting the esophagus from from acid injury and keeping stomach contents where they belong.  Eat smaller meals. A large meal remains in the stomach for several hours, increasing the chances for gastroesophageal reflux. Try distributing your daily food intake over three, four, or five smaller meals.  Relax when you eat. Stress increases the production of stomach acid, so make meals a pleasant, relaxing experience. Sit down. Eat slowly. Chew completely. Play soothing music.  Relax between meals. Relaxation therapies such as deep breathing, meditation, massage, tai chi, or yoga may help prevent and relieve heartburn.   Remain upright after eating. You should maintain postures that reduce the risk for reflux for at least three hours after eating. For example, don't bend over or strain to lift heavy objects.  Avoid eating within three hours of going to bed. Lying down after eating will increase chances of reflux.  Lose weight. Excess pounds increase pressure on the stomach and can push acid into the esophagus.  Loosen up. Avoid tight belts, waistbands, and other clothing that puts pressure on your stomach.  Avoid foods that burn. Abstain from food or drink that increases gastric acid secretion, decreases  the valve at the bottom of the esophagus, or slows the emptying of the stomach. Known offenders include high-fat foods, spicy dishes, tomatoes and tomato products, citrus fruits, garlic, onions, milk, carbonated drinks, coffee (including decaf), tea, chocolate, mints, and alcohol.  Avoid medications that can predispose you to reflux including aspirin and other NSAIDs, oral contraceptives, hormone therapy drugs, and certain antidepressants.  Sleep at an angle. If you're bothered by nighttime heartburn, place a wedge (available in medical supply stores or a wedge pillow through [employer]) under your upper body. But don't elevate your head with extra pillows. That makes reflux worse by bending you at the waist and compressing your stomach. You might also try sleeping on your left side, as studies have shown this can help--perhaps because the stomach is on the left side of the body, so lying on your left positions most of the stomach below the bottom of the esophagus.   ENDOSCOPY:   You have been scheduled for an endoscopy. Please follow written instructions given to you at your visit today.  INHALERS:   If you use inhalers (even only as needed), please bring them with you on the day of your procedure. It was my pleasure to provide care to you today. Based on our discussion, I am providing you with my recommendations below:  RECOMMENDATION(S):    BMI:  If you are age 48 or older, your body mass index should be between 23-30. Your Body mass index is 24.83 kg/m. If this is out of the aforementioned range listed, please consider follow up with your Primary Care Provider.  If you are age 13 or younger, your body mass index  should be between 19-25. Your Body mass index is 24.83 kg/m. If this is out of the aformentioned range listed, please consider follow up with your Primary Care Provider.   MY CHART:  The [facility] providers would like to encourage you to use [facility] to communicate with providers  for non-urgent requests or questions.  Due to long hold times on the telephone, sending your provider a message by [facility] may be a faster and more efficient way to get a response.  Please allow 48 business hours for a response.  Please remember that this is for non-urgent requests.   Thank you for trusting me with your gastrointestinal care!    Thornton Park, MD, MPH

## 2021-04-03 ENCOUNTER — Encounter: Payer: 59 | Admitting: Gastroenterology

## 2021-04-07 DIAGNOSIS — Z8619 Personal history of other infectious and parasitic diseases: Secondary | ICD-10-CM

## 2021-04-07 HISTORY — DX: Personal history of other infectious and parasitic diseases: Z86.19

## 2021-04-13 ENCOUNTER — Ambulatory Visit (AMBULATORY_SURGERY_CENTER): Payer: 59 | Admitting: Gastroenterology

## 2021-04-13 ENCOUNTER — Encounter: Payer: Self-pay | Admitting: Gastroenterology

## 2021-04-13 VITALS — BP 115/68 | HR 71 | Temp 98.2°F | Resp 10 | Ht 65.0 in | Wt 149.0 lb

## 2021-04-13 DIAGNOSIS — K2950 Unspecified chronic gastritis without bleeding: Secondary | ICD-10-CM | POA: Diagnosis not present

## 2021-04-13 DIAGNOSIS — K219 Gastro-esophageal reflux disease without esophagitis: Secondary | ICD-10-CM | POA: Diagnosis not present

## 2021-04-13 DIAGNOSIS — K2970 Gastritis, unspecified, without bleeding: Secondary | ICD-10-CM

## 2021-04-13 DIAGNOSIS — K3189 Other diseases of stomach and duodenum: Secondary | ICD-10-CM | POA: Diagnosis not present

## 2021-04-13 DIAGNOSIS — R1310 Dysphagia, unspecified: Secondary | ICD-10-CM | POA: Diagnosis present

## 2021-04-13 DIAGNOSIS — Z8719 Personal history of other diseases of the digestive system: Secondary | ICD-10-CM | POA: Diagnosis not present

## 2021-04-13 HISTORY — PX: ESOPHAGOGASTRODUODENOSCOPY (EGD) WITH PROPOFOL: SHX5813

## 2021-04-13 MED ORDER — SODIUM CHLORIDE 0.9 % IV SOLN: 500.0000 mL | Freq: Once | INTRAVENOUS | Status: DC

## 2021-04-13 NOTE — Patient Instructions (Signed)
YOU HAD AN ENDOSCOPIC PROCEDURE TODAY AT THE [facility]:   Refer to the procedure report that was given to you for any specific questions about what was found during the examination.  If the procedure report does not answer your questions, please call your gastroenterologist to clarify.  If you requested that your care partner not be given the details of your procedure findings, then the procedure report has been included in a sealed envelope for you to review at your convenience later.  YOU SHOULD EXPECT: Some feelings of bloating in the abdomen. Passage of more gas than usual.  Walking can help get rid of the air that was put into your GI tract during the procedure and reduce the bloating. If you had a lower endoscopy (such as a colonoscopy or flexible sigmoidoscopy) you may notice spotting of blood in your stool or on the toilet paper. If you underwent a bowel prep for your procedure, you may not have a normal bowel movement for a few days.  Please Note:  You might notice some irritation and congestion in your nose or some drainage.  This is from the oxygen used during your procedure.  There is no need for concern and it should clear up in a day or so.  SYMPTOMS TO REPORT IMMEDIATELY:   Following lower endoscopy (colonoscopy or flexible sigmoidoscopy):  Excessive amounts of blood in the stool  Significant tenderness or worsening of abdominal pains  Swelling of the abdomen that is new, acute  Fever of 100F or higher   Following upper endoscopy (EGD)  Vomiting of blood or coffee ground material  New chest pain or pain under the shoulder blades  Painful or persistently difficult swallowing  New shortness of breath  Fever of 100F or higher  Black, tarry-looking stools  For urgent or emergent issues, a gastroenterologist can be reached at any hour by calling [phone]. Do not use MyChart messaging for urgent concerns.    DIET:  We do recommend a small meal at first, but  then you may proceed to your regular diet.  Drink plenty of fluids but you should avoid alcoholic beverages for 24 hours.  ACTIVITY:  You should plan to take it easy for the rest of today and you should NOT DRIVE or use heavy machinery until tomorrow (because of the sedation medicines used during the test).    FOLLOW UP: Our staff will call the number listed on your records 48-72 hours following your procedure to check on you and address any questions or concerns that you may have regarding the information given to you following your procedure. If we do not reach you, we will leave a message.  We will attempt to reach you two times.  During this call, we will ask if you have developed any symptoms of COVID 19. If you develop any symptoms (ie: fever, flu-like symptoms, shortness of breath, cough etc.) before then, please call [phone].  If you test positive for Covid 19 in the 2 weeks post procedure, please call and report this information to us.    If any biopsies were taken you will be contacted by phone or by letter within the next 1-3 weeks.  Please call us at [phone] if you have not heard about the biopsies in 3 weeks.    SIGNATURES/CONFIDENTIALITY: You and/or your care partner have signed paperwork which will be entered into your electronic medical record.  These signatures attest to the fact that that the information above on   your After Visit Summary has been reviewed and is understood.  Full responsibility of the confidentiality of this discharge information lies with you and/or your care-partner. 

## 2021-04-13 NOTE — Progress Notes (Signed)
Check in-AM    V/S-DT 

## 2021-04-13 NOTE — Progress Notes (Signed)
Called to room to assist during endoscopic procedure.  Patient ID and intended procedure confirmed with present staff. Received instructions for my participation in the procedure from the performing physician.  

## 2021-04-13 NOTE — Op Note (Addendum)
[facility] Patient Name: Amy Gaines Procedure Date: 04/13/2021 11:29 AM MRN: [id number] Endoscopist: Tressia Danas MD, MD Age: 35 Referring MD:  Date of Birth: [birth year] Gender: Female Account #: [id number] Procedure:                Upper GI endoscopy Indications:              Longstanding reflux present for over 20 years                           Dysphagia to solids and liquids x6 months                           Globus                           Left upper quadrant postprandial abdominal pain                           Recent use of meloxicam for right trapezius muscle                            strain Medicines:                Monitored Anesthesia Care Procedure:                Pre-Anesthesia Assessment:                           - Prior to the procedure, a History and Physical                            was performed, and patient medications and                            allergies were reviewed. The patient's tolerance of                            previous anesthesia was also reviewed. The risks                            and benefits of the procedure and the sedation                            options and risks were discussed with the patient.                            All questions were answered, and informed consent                            was obtained. Prior Anticoagulants: The patient has                            taken no previous anticoagulant or antiplatelet                            agents. ASA  Grade Assessment: II - A patient with                            mild systemic disease. After reviewing the risks                            and benefits, the patient was deemed in                            satisfactory condition to undergo the procedure.                           After obtaining informed consent, the endoscope was                            passed under direct vision. Throughout the                            procedure, the patient's blood  pressure, pulse, and                            oxygen saturations were monitored continuously. The                            GIF [id number] #[id number] was introduced through the                            mouth, and advanced to the third part of duodenum.                            The upper GI endoscopy was accomplished without                            difficulty. The patient tolerated the procedure                            well. Scope In: Scope Out: Findings:                 The examined esophagus was normal. The z-line is                            located 38 cm from the incisors. Biopsies were                            obtained from the mid/proximal and distal esophagus                            with cold forceps for histology of suspected                            eosinophilic esophagitis.                           The entire examined stomach was normal.  Biopsies                            were taken from the antrum, body, and fundus. with                            a cold forceps for histology. Estimated blood loss                            was minimal.                           The examined duodenum was normal. Biopsies were                            taken with a cold forceps for histology. Estimated                            blood loss was minimal.                           The cardia and gastric fundus were normal on                            retroflexion.                           The exam was otherwise without abnormality. Complications:            No immediate complications. Estimated blood loss:                            Minimal. Estimated Blood Loss:     Estimated blood loss was minimal. Impression:               - Normal esophagus. Biopsied.                           - Normal stomach. Biopsied.                           - Normal examined duodenum. Biopsied.                           - The examination was otherwise normal. Recommendation:           - Patient has a  contact number available for                            emergencies. The signs and symptoms of potential                            delayed complications were discussed with the                            patient. Return to normal activities tomorrow.  Written discharge instructions were provided to the                            patient.                           - Resume previous diet.                           - Continue present medications including                            pantoprazole 40 mg QAM.                           - Await pathology results.                           - Proceed with esophagram if biopsies are negative. Tressia Danas MD, MD 04/13/2021 11:42:15 AM This report has been signed electronically.

## 2021-04-13 NOTE — Progress Notes (Signed)
Report to PACU, RN, vss, BBS= Clear.  

## 2021-04-13 NOTE — Progress Notes (Signed)
Referring Provider: Georgina Gaines, * Primary Care Physician:  Amy Quint, MD   Indication for Procedure: Dysphagia   IMPRESSION:  Longstanding reflux present for over 20 years Dysphagia to solids and liquids x6 months Globus Left upper quadrant postprandial abdominal pain Recent use of meloxicam for right trapezius muscle strain Appropriate candidate for monitored anesthesia care in the [facility]   PLAN: EGD with biopsies and possible dilation  HPI: Amy Gaines is a 35 y.o. female presents for endoscopic evaluation of dysphagia.    She has a long history of reflux symptoms now with 6-12 months of progressive reflux, dysphagia to solids and liquids, sensation that something is stuck in the throat localized to the cervical esophagus, bloating, distension and frequent eructation. Eructation provides some relief. Reflux developed as a teenager at age 64.  There is no dysphonia, sore throat, neck pain, nausea, or vomiting.  However, she recently had a strain of the right trapezius muscle treated with meloxicam and cyclobenzaprine.  No evidence for GI bleeding, iron deficiency anemia, anorexia, unexplained weight loss, odynophagia, persistent vomiting, or gastrointestinal cancer in a first-degree relative.   For the last 6 to 7 months she is also experienced an intermittent, postprandial, sharp, nonradiating left upper quadrant abdominal pain. The pain is located under the ribs.  No improvement with Prilosec OTC x 1 month. Avoiding lactose and greasy foods has not provided any relief.    No prior abdominal imaging.  No prior endoscopic evaluation.  Most recent labs from 12/2018 showing normal CMP and CBC except for a glucose of 115  Mother with gastritis. No family history of gastrointestinal cancer in a first-degree relative.  Maternal grandmother with cervical cancer.   Past Medical History:  Diagnosis Date   Anemia    Asthma    GERD (gastroesophageal  reflux disease)    Hepatitis A    as a teenager    Past Surgical History:  Procedure Laterality Date   lump removed from left axilla  2007    Current Outpatient Medications  Medication Sig Dispense Refill   pantoprazole (PROTONIX) 40 MG tablet Take 1 tablet (40 mg total) by mouth daily. 90 tablet 3   No current facility-administered medications for this visit.    Allergies as of 04/13/2021 - Review Complete 04/13/2021  Allergen Reaction Noted   Gadolinium derivatives Hives, Nausea And Vomiting, and Rash 01/03/2019    Family History  Problem Relation Age of Onset   Diabetes Mother    Heart disease Mother    Hypertension Mother    Cancer Maternal Grandmother        cervical   Diabetes Maternal Grandmother    Heart disease Maternal Grandmother    Migraines Cousin    Colon cancer Neg Hx    Esophageal cancer Neg Hx    Pancreatic cancer Neg Hx    Stomach cancer Neg Hx    Liver disease Neg Hx    Colon polyps Neg Hx    Rectal cancer Neg Hx     Physical Exam: General:   Alert,  well-nourished, pleasant and cooperative in NAD Head:  Normocephalic and atraumatic. Eyes:  Sclera clear, no icterus.   Conjunctiva pink. Ears:  Normal auditory acuity. Nose:  No deformity, discharge,  or lesions. Mouth:  No deformity or lesions.   Neck:  Supple; no masses or thyromegaly. Lungs:  Clear throughout to auscultation.   No wheezes. Heart:  Regular rate and rhythm; no murmurs. Abdomen:  Soft, nontender, nondistended, normal  bowel sounds, no rebound or guarding. No hepatosplenomegaly.  Although I am unable to reproduce her pain, she localizes the intermittent pain to an area in the LUQ just under her ribs.  Rectal:  Deferred  Msk:  Symmetrical. No boney deformities LAD: No inguinal or umbilical LAD Extremities:  No clubbing or edema. Neurologic:  Alert and  oriented x4;  grossly nonfocal Skin:  Intact without significant lesions or rashes. Psych:  Alert and cooperative. Normal mood  and affect.     Trevionne Advani L. Orvan Falconer, MD, MPH 04/13/2021, 11:25 AM

## 2021-04-15 ENCOUNTER — Telehealth: Payer: Self-pay

## 2021-04-15 NOTE — Telephone Encounter (Signed)
  Follow up Call-  Call back number 04/13/2021  Post procedure Call Back phone  # [phone]  Permission to leave phone message Yes  Some recent data might be hidden     Patient questions:  Do you have a fever, pain , or abdominal swelling? No. Pain Score  0 *  Have you tolerated food without any problems? Yes.    Have you been able to return to your normal activities? Yes.    Do you have any questions about your discharge instructions: Diet   No. Medications  No. Follow up visit  No.  Do you have questions or concerns about your Care? No.  Actions: * If pain score is 4 or above: No action needed, pain <4.

## 2021-04-17 ENCOUNTER — Other Ambulatory Visit: Payer: Self-pay

## 2021-04-17 DIAGNOSIS — B9681 Helicobacter pylori [H. pylori] as the cause of diseases classified elsewhere: Secondary | ICD-10-CM

## 2021-04-17 DIAGNOSIS — Z8719 Personal history of other diseases of the digestive system: Secondary | ICD-10-CM

## 2021-04-17 MED ORDER — METRONIDAZOLE 500 MG PO TABS: 500.0000 mg | ORAL_TABLET | Freq: Two times a day (BID) | ORAL | 0 refills | Status: AC

## 2021-04-17 MED ORDER — TETRACYCLINE HCL 500 MG PO CAPS: 500.0000 mg | ORAL_CAPSULE | Freq: Four times a day (QID) | ORAL | 0 refills | Status: DC

## 2021-04-17 MED ORDER — PANTOPRAZOLE SODIUM 40 MG PO TBEC: 40.0000 mg | DELAYED_RELEASE_TABLET | Freq: Two times a day (BID) | ORAL | 0 refills | Status: DC

## 2021-04-17 MED ORDER — BISMUTH SUBSALICYLATE 262 MG PO CHEW: 262.0000 mg | CHEWABLE_TABLET | Freq: Four times a day (QID) | ORAL | 0 refills | Status: AC

## 2021-04-24 ENCOUNTER — Telehealth: Payer: Self-pay | Admitting: Gastroenterology

## 2021-04-24 ENCOUNTER — Other Ambulatory Visit: Payer: Self-pay

## 2021-04-24 DIAGNOSIS — B9681 Helicobacter pylori [H. pylori] as the cause of diseases classified elsewhere: Secondary | ICD-10-CM

## 2021-04-24 DIAGNOSIS — K2970 Gastritis, unspecified, without bleeding: Secondary | ICD-10-CM

## 2021-04-24 MED ORDER — TALICIA 250-12.5-10 MG PO CPDR: 4.0000 | DELAYED_RELEASE_CAPSULE | Freq: Three times a day (TID) | ORAL | 0 refills | Status: AC

## 2021-04-24 NOTE — Telephone Encounter (Signed)
Agree with recommendations for stopping tetracycline, adding tetracycline to allergy list and using diphenhydramine per over-the-counter box instruction for allergic reaction If any issues with swelling of the face, mouth or tongue, dyspnea, chest pain or pressure she should call 911 and go to the ER immediately  Would allow for complete resolution of allergic reaction symptoms and once she has returned to normal we can prescribe Talicia -- this has PPI included and she would not need addition PPI during treatment Dr. Orvan Falconer has already recommended H. pylori stool antigen after therapy  I have included Dr. Orvan Falconer on this response to ensure she agrees with my substituted H. pylori treatment  Carie Caddy. Khylen Riolo, M.D.  04/24/2021

## 2021-04-24 NOTE — Telephone Encounter (Signed)
Called pt to inquire further. Denies dyspnea or having any feeling of her throat swelling or tightening. Pt has been advised to stop Tetracycline. May take oral Benadryl 1-2 tablets q 4-6 hrs prn for histamine reaction and may apply topical Benadryl to affected areas prn for symptomatic relief of itching. If her symptoms worsen to proceed to ED for anaphylaxis treatment. Will route this information to DOD for alternative to Tetracycline and to provide further recommendations, if any.  MAR updated to reflect d/c of Tetracycline and has been added to allergy list.

## 2021-04-24 NOTE — Telephone Encounter (Signed)
Called pt and made her aware of Dr. Lauro Franklin response below. Verbalized acceptance and understanding. Will plan to f/u with her on Monday to eval symptoms. If her itching, redness and facial swelling has resolved, will proceed with Talicia. Verbalized acceptance and understanding. In the meantime, will proceed with auth process for [place]. If denied, will obtain samples.   PRIOR AUTHORIZATION  PA initiation date: 04/24/21  Medication: [employer] Company: Friday [employer] Plan Submission completed electronically through Cover My Meds: Yes  ? There was an error with your request  ? The [employer] Prior Authorization Department is unable to review this request at this time. Our records indicate that this is not one of [employer]'s members. Please review the member's insurance demographics and submit to the appropriate party. Thank you in advance.  Notified pt to make her aware of encountered error. Pt states she pays for her plan monthly. States she will call her insurance company to determine what the issue is and will call our office back with an update.

## 2021-04-24 NOTE — Telephone Encounter (Signed)
Inbound call from patient stating after starting the medications that we prescribed she started to have an allergic reaction; face turned red and somewhat swollen with itching.  She spoke with her mom who did tell her she had a reaction to tetracycline when she was younger.  Please advise.

## 2021-04-25 ENCOUNTER — Telehealth: Payer: Self-pay | Admitting: Physician Assistant

## 2021-04-25 NOTE — Telephone Encounter (Signed)
Telephone call  04/25/2021  10:30 AM  Patient called on-call service this weekend and reported that she cannot afford the Talicia which was sent in for her.  Upon reading her chart it looks like her nursing staff is already working on this for her with plans to call her back on Monday to arrange for something else and or go through prior authorization.  Explained this process to her.  She verbalized understanding and was thankful for the call.  Hyacinth Meeker, PA-C

## 2021-04-26 ENCOUNTER — Other Ambulatory Visit: Payer: Self-pay | Admitting: Emergency Medicine

## 2021-04-26 DIAGNOSIS — M7918 Myalgia, other site: Secondary | ICD-10-CM

## 2021-04-27 NOTE — Telephone Encounter (Signed)
Amy Gaines - see below. Can we get samples of Amy Gaines?  Called and spoke with pt re: symptoms of facial redness, facial swelling and facial itching. States these symptoms have improved since stopping Tetracycline. Advised about PA response as below. Advised to continue medications as prescribed until we are able to obtain samples of Amy Gaines. Advised I will call with an update about samples once received. Routing this message to Amy Gaines, CMA to obtain samples of Amy Gaines.  DENIAL  Medication: [employer]: [employer] PA response: DENIED Rationale: Not covered by benefit plan

## 2021-04-27 NOTE — Telephone Encounter (Signed)
Let me text the rep

## 2021-05-06 NOTE — Telephone Encounter (Signed)
Called and spoke with pt advising Talicia sample is here for her to pick up. Advised she is welcome to stop by the front desk to pick up. States she will be here tomorrow to pick up.

## 2021-07-07 ENCOUNTER — Ambulatory Visit: Payer: 59 | Admitting: Gastroenterology

## 2021-07-14 ENCOUNTER — Other Ambulatory Visit: Payer: 59

## 2021-07-14 DIAGNOSIS — B9681 Helicobacter pylori [H. pylori] as the cause of diseases classified elsewhere: Secondary | ICD-10-CM

## 2021-07-16 LAB — HELICOBACTER PYLORI  SPECIAL ANTIGEN
MICRO NUMBER:: 12981263
SPECIMEN QUALITY: ADEQUATE

## 2021-07-22 ENCOUNTER — Encounter: Payer: Self-pay | Admitting: Gastroenterology

## 2021-07-30 ENCOUNTER — Encounter: Payer: Self-pay | Admitting: Gastroenterology

## 2021-07-30 ENCOUNTER — Ambulatory Visit: Payer: 59 | Admitting: Gastroenterology

## 2021-07-30 VITALS — BP 106/76 | HR 70 | Resp 16 | Ht 65.0 in | Wt 148.8 lb

## 2021-07-30 DIAGNOSIS — K2970 Gastritis, unspecified, without bleeding: Secondary | ICD-10-CM

## 2021-07-30 DIAGNOSIS — B9681 Helicobacter pylori [H. pylori] as the cause of diseases classified elsewhere: Secondary | ICD-10-CM

## 2021-07-30 DIAGNOSIS — K2100 Gastro-esophageal reflux disease with esophagitis, without bleeding: Secondary | ICD-10-CM | POA: Diagnosis not present

## 2021-07-30 MED ORDER — PANTOPRAZOLE SODIUM 40 MG PO TBEC: 40.0000 mg | DELAYED_RELEASE_TABLET | Freq: Every day | ORAL | 3 refills | Status: DC

## 2021-07-30 NOTE — Patient Instructions (Addendum)
It was a pleasure to see you today. I am delighted that you are feeling better.  Continue to use your pantoprazole as needed for symptoms.  The following strategies help you prevent heartburn and other symptoms by avoiding foods that reduce the effectiveness of the bottom of the esophagus from protecting the esophagus from from acid injury and keeping stomach contents where they belong.  Eat smaller meals. A large meal remains in the stomach for several hours, increasing the chances for gastroesophageal reflux. Try distributing your daily food intake over three, four, or five smaller meals.  Relax when you eat. Stress increases the production of stomach acid, so make meals a pleasant, relaxing experience. Sit down. Eat slowly. Chew completely. Play soothing music.  Relax between meals. Relaxation therapies such as deep breathing, meditation, massage, tai chi, or yoga may help prevent and relieve heartburn.   Remain upright after eating. You should maintain postures that reduce the risk for reflux for at least three hours after eating. For example, dont bend over or strain to lift heavy objects.  Avoid eating within three hours of going to bed. Lying down after eating will increase chances of reflux.  Lose weight. Excess pounds increase pressure on the stomach and can push acid into the esophagus.  Loosen up. Avoid tight belts, waistbands, and other clothing that puts pressure on your stomach.  Avoid foods that burn. Abstain from food or drink that increases gastric acid secretion, decreases the valve at the bottom of the esophagus, or slows the emptying of the stomach. Known offenders include high-fat foods, spicy dishes, tomatoes and tomato products, citrus fruits, garlic, onions, milk, carbonated drinks, coffee (including decaf), tea, chocolate, mints, and alcohol.  Avoid medications that can predispose you to reflux including aspirin and other NSAIDs, oral contraceptives, hormone therapy  drugs, and certain antidepressants.  Sleep at an angle. If youre bothered by nighttime heartburn, place a wedge (available in medical supply stores or a wedge pillow through [employer]) under your upper body. But dont elevate your head with extra pillows. That makes reflux worse by bending you at the waist and compressing your stomach. You might also try sleeping on your left side, as studies have shown this can help--perhaps because the stomach is on the left side of the body, so lying on your left positions most of the stomach below the bottom of the esophagus.     I did not schedule a follow-up appointment. However, please let me know if I can be of any assistance in the future.   I would like to see you at least every year if you are continuing to need pantoprazole.

## 2021-07-30 NOTE — Progress Notes (Signed)
Referring Provider: Georgina Quint, * Primary Care Physician:  Georgina Quint, MD   Chief Complaint: Dysphagia   IMPRESSION:  H. pylori gastritis presenting with left upper quadrant postprandial abdominal pain Biopsy-proven reflux esophagitis with associated GERD related dysmotility and globus Recent use of meloxicam for right trapezius muscle strain  Symptoms resolved with treated of H pylori. Currently using pantoprazole PRN to manage symptoms.   PLAN: - Continue reflux lifestyle modifications (brochure provided) - Avoid all NSAIDs - Continue pantoprazole 40 mg every morning, will try to taper the dose to the lowest possible dose - Abdominal ultrasound if symptoms return - Follow-up at least annually if she continues to require PPI therapy   HPI: Amy Gaines is a 36 y.o. female who returns in follow-up after her initial consultation and endoscopic evaluation.  The interval history is obtained through the patient and review of her electronic record.  She has a history of anemia and asthma. She declined the assistance of a Research officer, trade union.   Seen in consultation 03/30/21 for dysphagia reporting a long history of reflux symptoms  with 6-12 months of progressive reflux, dysphagia to solids and liquids, sensation that something is stuck in the throat localized to the cervical esophagus, bloating, distension and frequent eructation.  Reflux initially started at age 28.  She also reported 6 to 7 months of intermittent, postprandial, sharp, nonradiating left upper quadrant abdominal pain. The pain is located under the ribs. No improvement with Prilosec OTC x 1 month. Avoiding lactose and greasy foods provided no relief.    EGD 04/13/2021 was endoscopically normal.  Biopsies showed reflux esophagitis, H. pylori gastritis, and normal duodenum.   She returns today after completing 14 days of  [place]. Follow-up H pylori stool antigen obtained off of PPI therapy was  negative 07/15/21.  Symptoms have largely resolved except for some bloating that seems to occur around the time of her period. She will use pantoprazole PRN if she eats foods that may exacerbate her symptoms.  No new complaints or concerns that this time.    Past Medical History:  Diagnosis Date   Anemia    Asthma    GERD (gastroesophageal reflux disease)    Hepatitis A    as a teenager    Past Surgical History:  Procedure Laterality Date   lump removed from left axilla  2007    Current Outpatient Medications  Medication Sig Dispense Refill   pantoprazole (PROTONIX) 40 MG tablet Take 1 tablet (40 mg total) by mouth daily. 90 tablet 3   pantoprazole (PROTONIX) 40 MG tablet Take 1 tablet (40 mg total) by mouth 2 (two) times daily for 14 days. YOU WILL TAKE THIS MEDICATION X 14 DAYS, THEN STOP TAKING FOR 14 DAYS. YOU WILL THEN SUBMIT STOOL SAMPLE 28 tablet 0   No current facility-administered medications for this visit.    Allergies as of 07/30/2021 - Review Complete 07/30/2021  Allergen Reaction Noted   Tetracyclines & related Rash 04/24/2021   Gadolinium derivatives Hives, Nausea And Vomiting, and Rash 01/03/2019    Family History  Problem Relation Age of Onset   Diabetes Mother    Heart disease Mother    Hypertension Mother    Cancer Maternal Grandmother        cervical   Diabetes Maternal Grandmother    Heart disease Maternal Grandmother    Migraines Cousin    Colon cancer Neg Hx    Esophageal cancer Neg Hx    Pancreatic cancer Neg  Hx    Stomach cancer Neg Hx    Liver disease Neg Hx    Colon polyps Neg Hx    Rectal cancer Neg Hx      Review of Systems: 12 system ROS is negative except as noted above wit the addition of back pain, fatigue, itching, menstrual pain.   Physical Exam: General:   Alert,  well-nourished, pleasant and cooperative in NAD Head:  Normocephalic and atraumatic. Eyes:  Sclera clear, no icterus.   Conjunctiva pink. Ears:  Normal auditory  acuity. Nose:  No deformity, discharge,  or lesions. Mouth:  No deformity or lesions.   Neck:  Supple; no masses or thyromegaly. Lungs:  Clear throughout to auscultation.   No wheezes. Heart:  Regular rate and rhythm; no murmurs. Abdomen:  Soft, nontender, nondistended, normal bowel sounds, no rebound or guarding. No hepatosplenomegaly.  Although I am unable to reproduce her pain, she localizes the intermittent pain to an area in the LUQ just under her ribs.  Rectal:  Deferred  Msk:  Symmetrical. No boney deformities LAD: No inguinal or umbilical LAD Extremities:  No clubbing or edema. Neurologic:  Alert and  oriented x4;  grossly nonfocal Skin:  Intact without significant lesions or rashes. Psych:  Alert and cooperative. Normal mood and affect.     [name] L. Orvan Falconer, MD, MPH 07/30/2021, 9:29 AM

## 2021-10-26 ENCOUNTER — Encounter: Payer: Self-pay | Admitting: Emergency Medicine

## 2021-10-26 ENCOUNTER — Ambulatory Visit (INDEPENDENT_AMBULATORY_CARE_PROVIDER_SITE_OTHER): Payer: 59 | Admitting: Emergency Medicine

## 2021-10-26 VITALS — BP 120/76 | Temp 98.0°F | Ht 65.0 in | Wt 147.8 lb

## 2021-10-26 DIAGNOSIS — R87619 Unspecified abnormal cytological findings in specimens from cervix uteri: Secondary | ICD-10-CM | POA: Insufficient documentation

## 2021-10-26 DIAGNOSIS — Z130 Encounter for screening for diseases of the blood and blood-forming organs and certain disorders involving the immune mechanism: Secondary | ICD-10-CM

## 2021-10-26 DIAGNOSIS — Z8742 Personal history of other diseases of the female genital tract: Secondary | ICD-10-CM | POA: Insufficient documentation

## 2021-10-26 DIAGNOSIS — Z8719 Personal history of other diseases of the digestive system: Secondary | ICD-10-CM | POA: Diagnosis not present

## 2021-10-26 DIAGNOSIS — L292 Pruritus vulvae: Secondary | ICD-10-CM | POA: Insufficient documentation

## 2021-10-26 DIAGNOSIS — R87810 Cervical high risk human papillomavirus (HPV) DNA test positive: Secondary | ICD-10-CM | POA: Insufficient documentation

## 2021-10-26 DIAGNOSIS — Z1159 Encounter for screening for other viral diseases: Secondary | ICD-10-CM | POA: Diagnosis not present

## 2021-10-26 DIAGNOSIS — Z13228 Encounter for screening for other metabolic disorders: Secondary | ICD-10-CM

## 2021-10-26 DIAGNOSIS — Z13220 Encounter for screening for lipoid disorders: Secondary | ICD-10-CM

## 2021-10-26 DIAGNOSIS — Z0000 Encounter for general adult medical examination without abnormal findings: Secondary | ICD-10-CM

## 2021-10-26 DIAGNOSIS — Z1329 Encounter for screening for other suspected endocrine disorder: Secondary | ICD-10-CM

## 2021-10-26 HISTORY — DX: Cervical high risk human papillomavirus (HPV) DNA test positive: R87.810

## 2021-10-26 HISTORY — DX: Unspecified abnormal cytological findings in specimens from cervix uteri: R87.619

## 2021-10-26 HISTORY — DX: Pruritus vulvae: L29.2

## 2021-10-26 HISTORY — DX: Personal history of other diseases of the female genital tract: Z87.42

## 2021-10-26 LAB — COMPREHENSIVE METABOLIC PANEL
ALT: 13 U/L (ref 0–35)
AST: 16 U/L (ref 0–37)
Albumin: 4.7 g/dL (ref 3.5–5.2)
Alkaline Phosphatase: 49 U/L (ref 39–117)
BUN: 15 mg/dL (ref 6–23)
CO2: 27 mEq/L (ref 19–32)
Calcium: 9.5 mg/dL (ref 8.4–10.5)
Chloride: 105 mEq/L (ref 96–112)
Creatinine, Ser: 0.72 mg/dL (ref 0.40–1.20)
GFR: 106.4 mL/min (ref >60.00)
Glucose, Bld: 81 mg/dL (ref 70–99)
Potassium: 4 mEq/L (ref 3.5–5.1)
Sodium: 136 mEq/L (ref 135–145)
Total Bilirubin: 0.4 mg/dL (ref 0.2–1.2)
Total Protein: 7.5 g/dL (ref 6.0–8.3)

## 2021-10-26 LAB — CBC WITH DIFFERENTIAL/PLATELET
Basophils Absolute: 0 10*3/uL (ref 0.0–0.1)
Basophils Relative: 0.5 % (ref 0.0–3.0)
Eosinophils Absolute: 0.1 10*3/uL (ref 0.0–0.7)
Eosinophils Relative: 0.9 % (ref 0.0–5.0)
HCT: 39.8 % (ref 36.0–46.0)
Hemoglobin: 13.3 g/dL (ref 12.0–15.0)
Lymphocytes Relative: 35.9 % (ref 12.0–46.0)
Lymphs Abs: 2 10*3/uL (ref 0.7–4.0)
MCHC: 33.4 g/dL (ref 30.0–36.0)
MCV: 87.9 fl (ref 78.0–100.0)
Monocytes Absolute: 0.4 10*3/uL (ref 0.1–1.0)
Monocytes Relative: 7.6 % (ref 3.0–12.0)
Neutro Abs: 3.1 10*3/uL (ref 1.4–7.7)
Neutrophils Relative %: 55.1 % (ref 43.0–77.0)
Platelets: 262 10*3/uL (ref 150.0–400.0)
RBC: 4.53 Mil/uL (ref 3.87–5.11)
RDW: 13.6 % (ref 11.5–15.5)
WBC: 5.7 10*3/uL (ref 4.0–10.5)

## 2021-10-26 LAB — LIPID PANEL
Cholesterol: 175 mg/dL (ref 0–200)
HDL: 58.2 mg/dL (ref >39.00)
LDL Cholesterol: 101 mg/dL — ABNORMAL HIGH (ref 0–99)
NonHDL: 116.52
Total CHOL/HDL Ratio: 3
Triglycerides: 77 mg/dL (ref 0.0–149.0)
VLDL: 15.4 mg/dL (ref 0.0–40.0)

## 2021-10-26 LAB — HEMOGLOBIN A1C: Hgb A1c MFr Bld: 5.7 % (ref 4.6–6.5)

## 2021-10-26 NOTE — Patient Instructions (Signed)

## 2021-10-26 NOTE — Progress Notes (Signed)
Amy Gaines 36 y.o.   Chief Complaint  Patient presents with   Annual Exam    HISTORY OF PRESENT ILLNESS: This is a 36 y.o. female here for annual exam. Recent upper endoscopy and pathology reports showed H. pylori gastritis with reflux disease. Presently on pantoprazole.  Doing well. History of well-controlled asthma. Has appointment to see gynecologist this summer. Has no complaints or medical concerns today.  HPI   Prior to Admission medications   Medication Sig Start Date End Date Taking? Authorizing Provider  pantoprazole (PROTONIX) 40 MG tablet Take 1 tablet (40 mg total) by mouth daily. Patient not taking: Reported on 10/26/2021 07/30/21   Thornton Park, MD    Allergies  Allergen Reactions   Tetracyclines & Related Rash    Facial swelling, redness and itching   Gadolinium Derivatives Hives, Nausea And Vomiting and Rash    Pt had N/V followed with hives/rash    There are no problems to display for this patient.   Past Medical History:  Diagnosis Date   Anemia    Asthma    GERD (gastroesophageal reflux disease)    Hepatitis A    as a teenager    Past Surgical History:  Procedure Laterality Date   lump removed from left axilla  2007    Social History   Socioeconomic History   Marital status: Married    Spouse name: Not on file   Number of children: 2   Years of education: Not on file   Highest education level: High school graduate  Occupational History   Not on file  Tobacco Use   Smoking status: Never   Smokeless tobacco: Never  Vaping Use   Vaping Use: Never used  Substance and Sexual Activity   Alcohol use: No    Comment: occasional   Drug use: No   Sexual activity: Yes    Birth control/protection: Condom  Other Topics Concern   Not on file  Social History Narrative   Lives at home with her husband and 2 children   Right handed   Caffeine: "16 oz a day maybe"   Social Determinants of Radio broadcast assistant Strain:  Not on file  Food Insecurity: Not on file  Transportation Needs: Not on file  Physical Activity: Not on file  Stress: Not on file  Social Connections: Not on file  Intimate Partner Violence: Not on file    Family History  Problem Relation Age of Onset   Diabetes Mother    Heart disease Mother    Hypertension Mother    Cancer Maternal Grandmother        cervical   Diabetes Maternal Grandmother    Heart disease Maternal Grandmother    Migraines Cousin    Colon cancer Neg Hx    Esophageal cancer Neg Hx    Pancreatic cancer Neg Hx    Stomach cancer Neg Hx    Liver disease Neg Hx    Colon polyps Neg Hx    Rectal cancer Neg Hx      Review of Systems  Constitutional: Negative.  Negative for chills and fever.  HENT: Negative.  Negative for congestion and sore throat.   Respiratory: Negative.  Negative for cough and shortness of breath.   Cardiovascular: Negative.  Negative for chest pain and palpitations.  Gastrointestinal:  Positive for heartburn. Negative for abdominal pain, blood in stool, melena, nausea and vomiting.  Genitourinary: Negative.  Negative for dysuria and flank pain.  Skin: Negative.  Negative for rash.  Neurological:  Negative for dizziness and headaches.  All other systems reviewed and are negative.  Today's Vitals   10/26/21 0810  BP: 120/76  Temp: 98 F (36.7 C)  TempSrc: Oral  Weight: 147 lb 12.8 oz (67 kg)  Height: 5\' 5"  (1.651 m)   Body mass index is 24.6 kg/m.  Physical Exam Vitals reviewed.  Constitutional:      Appearance: Normal appearance.  HENT:     Head: Normocephalic.     Right Ear: Tympanic membrane, ear canal and external ear normal.     Left Ear: Tympanic membrane, ear canal and external ear normal.     Mouth/Throat:     Mouth: Mucous membranes are moist.     Pharynx: Oropharynx is clear.  Eyes:     Extraocular Movements: Extraocular movements intact.     Conjunctiva/sclera: Conjunctivae normal.     Pupils: Pupils are equal,  round, and reactive to light.  Cardiovascular:     Rate and Rhythm: Normal rate and regular rhythm.     Pulses: Normal pulses.     Heart sounds: Normal heart sounds.  Pulmonary:     Effort: Pulmonary effort is normal.     Breath sounds: Normal breath sounds.  Abdominal:     General: Bowel sounds are normal. There is no distension.     Palpations: Abdomen is soft.     Tenderness: There is no abdominal tenderness.  Musculoskeletal:        General: Normal range of motion.     Cervical back: No tenderness.     Right lower leg: No edema.     Left lower leg: No edema.  Lymphadenopathy:     Cervical: No cervical adenopathy.  Skin:    General: Skin is warm and dry.     Capillary Refill: Capillary refill takes less than 2 seconds.  Neurological:     General: No focal deficit present.     Mental Status: She is alert and oriented to person, place, and time.  Psychiatric:        Mood and Affect: Mood normal.        Behavior: Behavior normal.     ASSESSMENT & PLAN: Problem List Items Addressed This Visit   None Visit Diagnoses     Routine general medical examination at a health care facility    -  Primary   History of gastroesophageal reflux (GERD)       Need for hepatitis C screening test       Relevant Orders   Hepatitis C antibody screen   Screening for deficiency anemia       Relevant Orders   CBC with Differential   Screening for lipoid disorders       Relevant Orders   Lipid panel   Screening for endocrine, metabolic and immunity disorder       Relevant Orders   Comprehensive metabolic panel   Hemoglobin A1c       Modifiable risk factors discussed with patient. Anticipatory guidance according to age provided. The following topics were also discussed: Social Determinants of Health Smoking.  Non-smoker Diet and nutrition Benefits of exercise Cancer family history review Vaccinations recommendations.  Up-to-date. Cardiovascular risk assessment and need for blood  work Mental health including depression and anxiety Fall and accident prevention  Patient Instructions  Mantenimiento de Technical sales engineer en [place] [employer], Female Adoptar un estilo de vida saludable y recibir atencin preventiva son importantes para Teacher, English as a foreign language  y Musician. Consulte al mdico sobre: El esquema adecuado para hacerse pruebas y exmenes peridicos. Cosas que puede hacer por su cuenta para prevenir enfermedades y [place] sano. Qu debo saber sobre la dieta, el peso y el ejercicio? Consuma una dieta saludable  Consuma una dieta que incluya muchas verduras, frutas, productos lcteos con bajo contenido de [place] y Advertising account planner. No consuma muchos alimentos ricos en grasas slidas, azcares agregados o sodio. Mantenga un peso saludable El ndice de masa muscular [facility]) se [place] the [place] para identificar problemas de [place]. Proporciona una estimacin de la grasa corporal basndose en el peso y la altura. Su mdico puede ayudarle a Radiation protection practitioner [place] y a Scientist, forensic o Theatre manager un peso saludable. Haga ejercicio con regularidad Haga ejercicio con regularidad. Esta es una de las prcticas ms importantes que puede hacer por su salud. La [employer] de los adultos deben seguir estas pautas: Optometrist, al menos, 150 minutos de actividad fsica por semana. El ejercicio debe aumentar la frecuencia cardaca y Nature conservation officer transpirar (ejercicio de intensidad moderada). Hacer ejercicios de fortalecimiento por lo [employer] por semana. Agregue esto a su plan de ejercicio de intensidad moderada. Pase menos tiempo sentada. Incluso la actividad fsica ligera puede ser beneficiosa. Controle sus niveles de colesterol y lpidos en la sangre Comience a realizarse anlisis de lpidos y Research officer, trade union en la sangre a los 60 aos y luego reptalos cada 5 aos. Hgase controlar los niveles de colesterol con mayor frecuencia si: Sus niveles de lpidos y colesterol son altos. Es mayor de 45 aos. Presenta  un alto riesgo de padecer enfermedades cardacas. Qu debo saber sobre las pruebas de deteccin del cncer? Segn su historia clnica y sus antecedentes familiares, es posible que deba realizarse pruebas de deteccin del cncer en diferentes edades. Esto puede incluir pruebas de deteccin de lo siguiente: Cncer de mama. Cncer de cuello uterino. Cncer colorrectal. Cncer de piel. Cncer de pulmn. Qu debo saber sobre la enfermedad cardaca, la diabetes y la hipertensin arterial? Presin arterial y enfermedad cardaca La hipertensin arterial causa enfermedades cardacas y [place] el riesgo de accidente cerebrovascular. Es ms probable que esto se manifieste en las personas que tienen lecturas de presin arterial alta o tienen sobrepeso. Hgase controlar la presin arterial: Cada 3 a 5 aos si tiene entre 18 y 61 aos. Todos los aos si es mayor de 40 aos. Diabetes Realcese exmenes de deteccin de la diabetes con regularidad. Este anlisis revisa el nivel de azcar en la sangre en [place]. Hgase las pruebas de deteccin: Cada tres aos despus de los 58 aos de edad si tiene un peso normal y un bajo riesgo de padecer diabetes. Con ms frecuencia y a partir de [place] edad inferior si tiene sobrepeso o un alto riesgo de padecer diabetes. Qu debo saber sobre la prevencin de infecciones? Hepatitis B Si tiene un riesgo ms alto de contraer hepatitis B, debe someterse a un examen de deteccin de este virus. Hable con el mdico para averiguar si tiene riesgo de contraer la infeccin por hepatitis B. Hepatitis C Se recomienda el anlisis a: [employer] 1945 y 1965. Todas las personas que tengan un riesgo de haber contrado hepatitis C. Enfermedades de transmisin sexual (ETS) Hgase las pruebas de Programme researcher, broadcasting/film/video de ITS, incluidas la gonorrea y la clamidia, si: Es sexualmente activa y es menor de 22 aos. Es mayor de 73 aos, y Investment banker, operational informa que corre riesgo de tener  este tipo de infecciones. La actividad sexual ha [place]  desde que le hicieron la ltima prueba de deteccin y tiene un riesgo mayor de tener clamidia o Radio broadcast assistant. Pregntele al mdico si usted tiene riesgo. Pregntele al mdico si usted tiene un alto riesgo de Museum/gallery curator VIH. El mdico tambin puede recomendarle un medicamento recetado para ayudar a evitar la infeccin por el VIH. Si elige tomar medicamentos para prevenir el VIH, primero debe [employer] de deteccin del VIH. Luego debe hacerse anlisis cada 3 meses mientras est tomando los medicamentos. Embarazo Si est por dejar de Librarian, academic (fase premenopusica) y usted puede quedar [place], busque asesoramiento antes de [place]. Tome de 400 a 800 microgramos (mcg) de cido [employer] si [place]. Pida mtodos de control de la natalidad (anticonceptivos) si desea evitar un embarazo no deseado. Osteoporosis y [place] La osteoporosis es una enfermedad en la que los huesos pierden los minerales y la fuerza por el avance de la edad. El resultado pueden ser fracturas en los [place]. Si tiene 45 aos o ms, o si est en riesgo de sufrir osteoporosis y fracturas, pregunte a su mdico si debe: Hacerse pruebas de deteccin de prdida sea. Tomar un suplemento de calcio o de vitamina D para reducir el riesgo de fracturas. Recibir terapia de reemplazo hormonal (TRH) para tratar los sntomas de la menopausia. Siga estas indicaciones en su casa: Consumo de alcohol No beba alcohol si: Su mdico le indica no hacerlo. Est embarazada, puede estar embarazada o est tratando de [place]. Si bebe alcohol: Limite la cantidad que bebe a lo siguiente: De 0 a 1 bebida por da. Sepa cunta cantidad de alcohol hay en las bebidas que toma. En los [place], una medida equivale a una botella de cerveza de 12 oz (355 ml), un vaso de vino de 5 oz (148 ml) o un vaso de una bebida alcohlica de alta graduacin de 1  oz (44 ml). Estilo de vida No consuma ningn producto que contenga nicotina o tabaco. Estos productos incluyen cigarrillos, tabaco para Higher education careers adviser y aparatos de vapeo, como los Psychologist, sport and exercise. Si necesita ayuda para dejar de consumir estos productos, consulte al mdico. No consuma drogas. No comparta agujas. Solicite ayuda a su mdico si necesita apoyo o informacin para abandonar las drogas. Indicaciones generales Realcese los estudios de rutina de la salud, dentales y de Public librarian. [place]. Infrmele a su mdico si: Se siente deprimida con frecuencia. Alguna vez ha sido vctima de [place] o no se siente seguro en su casa. Resumen Adoptar un estilo de vida saludable y recibir atencin preventiva son importantes para promover la salud y Musician. Siga las instrucciones del mdico acerca de una dieta saludable, el ejercicio y la realizacin de pruebas o exmenes para Engineer, building services. Siga las instrucciones del mdico con respecto al control del colesterol y la presin arterial. Esta informacin no tiene Marine scientist el consejo del mdico. Asegrese de hacerle al mdico cualquier pregunta que tenga. Document Revised: 10/30/2020 Document Reviewed: 10/30/2020 Elsevier Patient Education  [place], MD [facility]

## 2021-10-27 LAB — HEPATITIS C ANTIBODY
Hepatitis C Ab: NONREACTIVE
SIGNAL TO CUT-OFF: 0.1 (ref <1.00)

## 2022-02-10 ENCOUNTER — Telehealth: Payer: Self-pay | Admitting: Emergency Medicine

## 2022-02-10 ENCOUNTER — Ambulatory Visit: Payer: 59 | Admitting: Emergency Medicine

## 2022-02-10 ENCOUNTER — Encounter: Payer: Self-pay | Admitting: Emergency Medicine

## 2022-02-10 VITALS — BP 102/74 | HR 72 | Temp 98.5°F | Ht 65.0 in | Wt 143.2 lb

## 2022-02-10 DIAGNOSIS — R531 Weakness: Secondary | ICD-10-CM

## 2022-02-10 DIAGNOSIS — R5382 Chronic fatigue, unspecified: Secondary | ICD-10-CM | POA: Insufficient documentation

## 2022-02-10 DIAGNOSIS — N179 Acute kidney failure, unspecified: Secondary | ICD-10-CM

## 2022-02-10 HISTORY — DX: Weakness: R53.1

## 2022-02-10 LAB — CBC WITH DIFFERENTIAL/PLATELET
Basophils Absolute: 0 10*3/uL (ref 0.0–0.1)
Basophils Relative: 0.7 % (ref 0.0–3.0)
Eosinophils Absolute: 0 10*3/uL (ref 0.0–0.7)
Eosinophils Relative: 0.9 % (ref 0.0–5.0)
HCT: 39.8 % (ref 36.0–46.0)
Hemoglobin: 13.2 g/dL (ref 12.0–15.0)
Lymphocytes Relative: 39.3 % (ref 12.0–46.0)
Lymphs Abs: 1.8 10*3/uL (ref 0.7–4.0)
MCHC: 33.2 g/dL (ref 30.0–36.0)
MCV: 88.6 fl (ref 78.0–100.0)
Monocytes Absolute: 0.4 10*3/uL (ref 0.1–1.0)
Monocytes Relative: 9 % (ref 3.0–12.0)
Neutro Abs: 2.3 10*3/uL (ref 1.4–7.7)
Neutrophils Relative %: 50.1 % (ref 43.0–77.0)
Platelets: 265 10*3/uL (ref 150.0–400.0)
RBC: 4.49 Mil/uL (ref 3.87–5.11)
RDW: 13.6 % (ref 11.5–15.5)
WBC: 4.5 10*3/uL (ref 4.0–10.5)

## 2022-02-10 LAB — COMPREHENSIVE METABOLIC PANEL
ALT: 13 U/L (ref 0–35)
AST: 15 U/L (ref 0–37)
Albumin: 4.3 g/dL (ref 3.5–5.2)
Alkaline Phosphatase: 53 U/L (ref 39–117)
BUN: 14 mg/dL (ref 6–23)
CO2: 26 mEq/L (ref 19–32)
Calcium: 9 mg/dL (ref 8.4–10.5)
Chloride: 104 mEq/L (ref 96–112)
Creatinine, Ser: 1.77 mg/dL — ABNORMAL HIGH (ref 0.40–1.20)
GFR: 36.08 mL/min — ABNORMAL LOW (ref >60.00)
Glucose, Bld: 93 mg/dL (ref 70–99)
Potassium: 3.8 mEq/L (ref 3.5–5.1)
Sodium: 138 mEq/L (ref 135–145)
Total Bilirubin: 0.3 mg/dL (ref 0.2–1.2)
Total Protein: 7.3 g/dL (ref 6.0–8.3)

## 2022-02-10 LAB — VITAMIN D 25 HYDROXY (VIT D DEFICIENCY, FRACTURES): VITD: 30.29 ng/mL (ref 30.00–100.00)

## 2022-02-10 LAB — VITAMIN B12: Vitamin B-12: 1097 pg/mL — ABNORMAL HIGH (ref 211–911)

## 2022-02-10 LAB — SEDIMENTATION RATE: Sed Rate: 9 mm/hr (ref 0–20)

## 2022-02-10 MED ORDER — ALBUTEROL SULFATE HFA 108 (90 BASE) MCG/ACT IN AERS: 2.0000 | INHALATION_SPRAY | Freq: Four times a day (QID) | RESPIRATORY_TRACT | 3 refills | Status: AC | PRN

## 2022-02-10 NOTE — Assessment & Plan Note (Signed)
Associated with multiple other symptoms. Differential diagnosis discussed with patient. Needs work-up. Stress/anxiety contributing factor. Blood work done today. We will follow-up after that.

## 2022-02-10 NOTE — Progress Notes (Signed)
Amy Gaines 36 y.o.   Chief Complaint  Patient presents with   Fatigue    Pt concerns about being fatigue, hair loss, stress, anxiety     HISTORY OF PRESENT ILLNESS: This is a 36 y.o. female complaining of multiple symptoms for the last several weeks including general fatigue and weakness, upper back pain and strain, hair loss, acne, cold hands and feet, occasional rapid heartbeat.  Increased stress and anxiety. Seen by me last May for physical.  Everything was okay.  Blood work was normal. Micah Flesher to see her gynecologist over the summer.  Normal examination. Non-smoker.  Married mother of 2.  Works in [employer].  Recently returned to school. No chronic medical problems.  No chronic medications. No other complaints or medical concerns.  HPI   Prior to Admission medications   Medication Sig Start Date End Date Taking? Authorizing Provider  pantoprazole (PROTONIX) 40 MG tablet Take 1 tablet (40 mg total) by mouth daily. Patient not taking: Reported on 10/26/2021 07/30/21   Tressia Danas, MD    Allergies  Allergen Reactions   Tetracyclines & Related Rash    Facial swelling, redness and itching   Gadolinium Derivatives Hives, Nausea And Vomiting and Rash    Pt had N/V followed with hives/rash    Patient Active Problem List   Diagnosis Date Noted   History of abnormal cervical Papanicolaou smear 10/26/2021   Pruritus of vulva 10/26/2021   Cervical high risk HPV (human papillomavirus) test positive 10/26/2021   Abnormal Pap smear of cervix 10/26/2021   Gastroesophageal reflux disease without esophagitis 10/03/2020   Intermittent asthma 10/03/2020    Past Medical History:  Diagnosis Date   Anemia    Asthma    GERD (gastroesophageal reflux disease)    Hepatitis A    as a teenager    Past Surgical History:  Procedure Laterality Date   lump removed from left axilla  2007    Social History   Socioeconomic History   Marital status: Married    Spouse name:  Not on file   Number of children: 2   Years of education: Not on file   Highest education level: High school graduate  Occupational History   Not on file  Tobacco Use   Smoking status: Never   Smokeless tobacco: Never  Vaping Use   Vaping Use: Never used  Substance and Sexual Activity   Alcohol use: No    Comment: occasional   Drug use: No   Sexual activity: Yes    Birth control/protection: Condom  Other Topics Concern   Not on file  Social History Narrative   Lives at home with her husband and 2 children   Right handed   Caffeine: "16 oz a day maybe"   Social Determinants of Corporate investment banker Strain: Not on file  Food Insecurity: Not on file  Transportation Needs: Not on file  Physical Activity: Not on file  Stress: Not on file  Social Connections: Not on file  Intimate Partner Violence: Not on file    Family History  Problem Relation Age of Onset   Diabetes Mother    Heart disease Mother    Hypertension Mother    Cancer Maternal Grandmother        cervical   Diabetes Maternal Grandmother    Heart disease Maternal Grandmother    Migraines Cousin    Colon cancer Neg Hx    Esophageal cancer Neg Hx    Pancreatic cancer Neg Hx  Stomach cancer Neg Hx    Liver disease Neg Hx    Colon polyps Neg Hx    Rectal cancer Neg Hx      Review of Systems  Constitutional:  Positive for malaise/fatigue. Negative for chills and fever.  HENT: Negative.  Negative for congestion and sore throat.   Respiratory: Negative.  Negative for cough and shortness of breath.   Cardiovascular:  Positive for palpitations. Negative for chest pain.  Gastrointestinal:  Negative for abdominal pain, diarrhea, nausea and vomiting.  Genitourinary: Negative.  Negative for dysuria and hematuria.  Musculoskeletal: Negative.   Skin: Negative.  Negative for rash.  Neurological: Negative.  Negative for dizziness and headaches.  All other systems reviewed and are negative.   Today's  Vitals   02/10/22 1317  BP: 102/74  Pulse: 72  Temp: 98.5 F (36.9 C)  TempSrc: Oral  SpO2: 99%  Weight: 143 lb 4 oz (65 kg)  Height: 5\' 5"  (1.651 m)   Body mass index is 23.84 kg/m. Wt Readings from Last 3 Encounters:  02/10/22 143 lb 4 oz (65 kg)  10/26/21 147 lb 12.8 oz (67 kg)  07/30/21 148 lb 12.8 oz (67.5 kg)    Physical Exam Vitals reviewed.  Constitutional:      Appearance: Normal appearance.  HENT:     Head: Normocephalic.     Mouth/Throat:     Mouth: Mucous membranes are moist.     Pharynx: Oropharynx is clear.  Eyes:     Extraocular Movements: Extraocular movements intact.     Conjunctiva/sclera: Conjunctivae normal.     Pupils: Pupils are equal, round, and reactive to light.  Cardiovascular:     Rate and Rhythm: Normal rate and regular rhythm.     Pulses: Normal pulses.     Heart sounds: Normal heart sounds.  Pulmonary:     Effort: Pulmonary effort is normal.     Breath sounds: Normal breath sounds.  Abdominal:     General: There is no distension.     Palpations: Abdomen is soft.     Tenderness: There is no abdominal tenderness.  Musculoskeletal:     Cervical back: No tenderness.  Lymphadenopathy:     Cervical: No cervical adenopathy.  Skin:    General: Skin is warm and dry.     Capillary Refill: Capillary refill takes less than 2 seconds.  Neurological:     General: No focal deficit present.     Mental Status: She is alert and oriented to person, place, and time.  Psychiatric:        Mood and Affect: Mood normal.        Behavior: Behavior normal.      ASSESSMENT & PLAN: A total of 45 minutes was spent with the patient and counseling/coordination of care regarding preparing for this visit, review of most recent office visit notes, review of most recent blood work results, differential diagnosis of general weakness and need for work-up, education on nutrition, prognosis, documentation, and need for follow-up.  Problem List Items Addressed This  Visit       Other   General weakness - Primary    Associated with multiple other symptoms. Differential diagnosis discussed with patient. Needs work-up. Stress/anxiety contributing factor. Blood work done today. We will follow-up after that.      Relevant Orders   CBC with Differential/Platelet   Comprehensive metabolic panel   ANA,IFA RA Diag Pnl w/rflx Tit/Patn   Sedimentation rate   Thyroid Panel With TSH  VITAMIN D 25 Hydroxy (Vit-D Deficiency, Fractures)   Vitamin B12   Patient Instructions  Mantenimiento de la salud en las mujeres Health Maintenance, Female Adoptar un estilo de vida saludable y recibir atencin preventiva son importantes para promover la salud y Counsellor. Consulte al mdico sobre: El esquema adecuado para hacerse pruebas y exmenes peridicos. Cosas que puede hacer por su cuenta para prevenir enfermedades y Vanderbilt sano. Qu debo saber sobre la dieta, el peso y el ejercicio? Consuma una dieta saludable  Consuma una dieta que incluya muchas verduras, frutas, productos lcteos con bajo contenido de [place] y Associate Professor. No consuma muchos alimentos ricos en grasas slidas, azcares agregados o sodio. Mantenga un peso saludable El ndice de masa muscular [facility]) se [place] para identificar problemas de [place]. Proporciona una estimacin de la grasa corporal basndose en el peso y la altura. Su mdico puede ayudarle a Engineer, site IMC y a Personnel officer o Pharmacologist un peso saludable. Haga ejercicio con regularidad Haga ejercicio con regularidad. Esta es una de las prcticas ms importantes que puede hacer por su salud. La [employer] de los adultos deben seguir estas pautas: Education officer, environmental, al menos, 150 minutos de actividad fsica por semana. El ejercicio debe aumentar la frecuencia cardaca y Media planner transpirar (ejercicio de intensidad moderada). Hacer ejercicios de fortalecimiento por lo [employer] por semana. Agregue esto a su plan de ejercicio de intensidad  moderada. Pase menos tiempo sentada. Incluso la actividad fsica ligera puede ser beneficiosa. Controle sus niveles de colesterol y lpidos en la sangre Comience a realizarse anlisis de lpidos y Oncologist en la sangre a los 20 aos y luego reptalos cada 5 aos. Hgase controlar los niveles de colesterol con mayor frecuencia si: Sus niveles de lpidos y colesterol son altos. Es mayor de 40 aos. Presenta un alto riesgo de padecer enfermedades cardacas. Qu debo saber sobre las pruebas de deteccin del cncer? Segn su historia clnica y sus antecedentes familiares, es posible que deba realizarse pruebas de deteccin del cncer en diferentes edades. Esto puede incluir pruebas de deteccin de lo siguiente: Cncer de mama. Cncer de cuello uterino. Cncer colorrectal. Cncer de piel. Cncer de pulmn. Qu debo saber sobre la enfermedad cardaca, la diabetes y la hipertensin arterial? Presin arterial y enfermedad cardaca La hipertensin arterial causa enfermedades cardacas y [place] el riesgo de accidente cerebrovascular. Es ms probable que esto se manifieste en las personas que tienen lecturas de presin arterial alta o tienen sobrepeso. Hgase controlar la presin arterial: Cada 3 a 5 aos si tiene entre 18 y 59 aos. Todos los aos si es mayor de 40 aos. Diabetes Realcese exmenes de deteccin de la diabetes con regularidad. Este anlisis revisa el nivel de azcar en la sangre en [place]. Hgase las pruebas de deteccin: Cada tres aos despus de los 40 aos de edad si tiene un peso normal y un bajo riesgo de padecer diabetes. Con ms frecuencia y a partir de [place] edad inferior si tiene sobrepeso o un alto riesgo de padecer diabetes. Qu debo saber sobre la prevencin de infecciones? Hepatitis B Si tiene un riesgo ms alto de contraer hepatitis B, debe someterse a un examen de deteccin de este virus. Hable con el mdico para averiguar si tiene riesgo de contraer la infeccin  por hepatitis B. Hepatitis C Se recomienda el anlisis a: [employer] 1945 y 1965. Todas las personas que tengan un riesgo de haber contrado hepatitis C. Enfermedades de transmisin sexual (ETS) Hgase las pruebas de Airline pilot  de ITS, incluidas la gonorrea y la clamidia, si: Es sexualmente activa y es menor de [address]. Es mayor de [address], y Public affairs consultant informa que corre riesgo de tener este tipo de infecciones. La actividad sexual ha cambiado desde que le hicieron la ltima prueba de deteccin y tiene un riesgo mayor de Warehouse manager clamidia o Copy. Pregntele al mdico si usted tiene riesgo. Pregntele al mdico si usted tiene un alto riesgo de Primary school teacher VIH. El mdico tambin puede recomendarle un medicamento recetado para ayudar a evitar la infeccin por el VIH. Si elige tomar medicamentos para prevenir el VIH, primero debe [employer] de deteccin del VIH. Luego debe hacerse anlisis cada 3 meses mientras est tomando los medicamentos. Embarazo Si est por dejar de Armed forces training and education officer (fase premenopusica) y usted puede quedar [place], busque asesoramiento antes de [place]. [place] de 400 a 800 microgramos (mcg) de cido [employer] si [place]. Pida mtodos de control de la natalidad (anticonceptivos) si desea evitar un embarazo no deseado. Osteoporosis y [place] La osteoporosis es una enfermedad en la que los huesos pierden los minerales y la fuerza por el avance de la edad. El resultado pueden ser fracturas en los [place]. Si tiene 65 aos o ms, o si est en riesgo de sufrir osteoporosis y fracturas, pregunte a su mdico si debe: Hacerse pruebas de deteccin de prdida sea. Tomar un suplemento de calcio o de vitamina D para reducir el riesgo de fracturas. Recibir terapia de reemplazo hormonal (TRH) para tratar los sntomas de la menopausia. Siga estas indicaciones en su casa: Consumo de alcohol No beba alcohol si: Su mdico le indica no  hacerlo. Est embarazada, puede estar embarazada o est tratando de [place]. Si bebe alcohol: Limite la cantidad que bebe a lo siguiente: De 0 a 1 bebida por da. Sepa cunta cantidad de alcohol hay en las bebidas que toma. En los [address], una medida equivale a una botella de cerveza de 12 oz (355 ml), un vaso de vino de 5 oz (148 ml) o un vaso de una bebida alcohlica de alta graduacin de 1 oz (44 ml). Estilo de vida No consuma ningn producto que contenga nicotina o tabaco. Estos productos incluyen cigarrillos, tabaco para Theatre manager y aparatos de vapeo, como los Administrator, Civil Service. Si necesita ayuda para dejar de consumir estos productos, consulte al mdico. No consuma drogas. No comparta agujas. Solicite ayuda a su mdico si necesita apoyo o informacin para abandonar las drogas. Indicaciones generales Realcese los estudios de rutina de [address], dentales y de Wellsite geologist. Mantngase al da con las vacunas. Infrmele a su mdico si: Se siente deprimida con frecuencia. Alguna vez ha sido vctima de [place] o no se siente seguro en su casa. Resumen Adoptar un estilo de vida saludable y recibir atencin preventiva son importantes para promover la salud y Counsellor. Siga las instrucciones del mdico acerca de una dieta saludable, el ejercicio y la realizacin de pruebas o exmenes para Hotel manager. Siga las instrucciones del mdico con respecto al control del colesterol y la presin arterial. Esta informacin no tiene Theme park manager el consejo del mdico. Asegrese de hacerle al mdico cualquier pregunta que tenga. Document Revised: 10/30/2020 Document Reviewed: 10/30/2020 Elsevier Patient Education  2023 Elsevier Inc.    Edwina Barth, MD [facility]

## 2022-02-10 NOTE — Patient Instructions (Signed)

## 2022-02-10 NOTE — Telephone Encounter (Signed)
Blood results discussed with patient. Acute renal failure.  Needs nephrology evaluation.  Referral placed today.

## 2022-02-12 LAB — THYROID PANEL WITH TSH
Free Thyroxine Index: 2.1 (ref 1.4–3.8)
T3 Uptake: 28 % (ref 22–35)
T4, Total: 7.5 ug/dL (ref 5.1–11.9)
TSH: 2.62 mIU/L

## 2022-02-12 LAB — ANA,IFA RA DIAG PNL W/RFLX TIT/PATN
Anti Nuclear Antibody (ANA): NEGATIVE
Cyclic Citrullin Peptide Ab: 59 UNITS — ABNORMAL HIGH
Rheumatoid fact SerPl-aCnc: <14 IU/mL (ref <14)

## 2022-02-15 NOTE — Progress Notes (Signed)
Please look into the nephrology referral we placed urgently. See what the status is and please let me know.  Thanks.

## 2022-02-15 NOTE — Telephone Encounter (Signed)
NOTE NOT NEEDED ?

## 2022-02-16 ENCOUNTER — Ambulatory Visit (HOSPITAL_COMMUNITY)
Admission: EM | Admit: 2022-02-16 | Discharge: 2022-02-16 | Disposition: A | Discharge status: Home / Self Care | Payer: 59 | Source: Home / Self Care

## 2022-02-16 ENCOUNTER — Emergency Department (HOSPITAL_COMMUNITY)
Admission: EM | Admit: 2022-02-16 | Discharge: 2022-02-17 | Disposition: A | Discharge status: Home / Self Care | Payer: 59 | Attending: Emergency Medicine | Admitting: Emergency Medicine

## 2022-02-16 ENCOUNTER — Ambulatory Visit (INDEPENDENT_AMBULATORY_CARE_PROVIDER_SITE_OTHER): Payer: 59

## 2022-02-16 ENCOUNTER — Encounter (HOSPITAL_COMMUNITY): Payer: Self-pay | Admitting: Emergency Medicine

## 2022-02-16 ENCOUNTER — Other Ambulatory Visit: Payer: Self-pay

## 2022-02-16 ENCOUNTER — Encounter (HOSPITAL_COMMUNITY): Payer: Self-pay | Admitting: *Deleted

## 2022-02-16 DIAGNOSIS — J45909 Unspecified asthma, uncomplicated: Secondary | ICD-10-CM | POA: Insufficient documentation

## 2022-02-16 DIAGNOSIS — R300 Dysuria: Secondary | ICD-10-CM

## 2022-02-16 DIAGNOSIS — R109 Unspecified abdominal pain: Secondary | ICD-10-CM

## 2022-02-16 DIAGNOSIS — R3915 Urgency of urination: Secondary | ICD-10-CM | POA: Insufficient documentation

## 2022-02-16 DIAGNOSIS — N9489 Other specified conditions associated with female genital organs and menstrual cycle: Secondary | ICD-10-CM | POA: Diagnosis not present

## 2022-02-16 LAB — POCT URINALYSIS DIPSTICK, ED / UC
Bilirubin Urine: NEGATIVE
Glucose, UA: NEGATIVE mg/dL
Hgb urine dipstick: NEGATIVE
Ketones, ur: NEGATIVE mg/dL
Leukocytes,Ua: NEGATIVE
Nitrite: NEGATIVE
Protein, ur: NEGATIVE mg/dL
Specific Gravity, Urine: <=1.005 (ref 1.005–1.030)
Urobilinogen, UA: 0.2 mg/dL (ref 0.0–1.0)
pH: 6 (ref 5.0–8.0)

## 2022-02-16 NOTE — ED Triage Notes (Signed)
Pt states that she has pain when urinating and left sided lower back pain started yesterday afternoon. She hasnt tried any meds but increased fluids and cranberry juice. She was just referred to nephrology due to abnormal labs.

## 2022-02-16 NOTE — ED Triage Notes (Signed)
Pt c/o left flank pain and burning with urination. Seen at [facility] today for same.

## 2022-02-16 NOTE — Discharge Instructions (Signed)
Unfortunately I was unable to determine a cause today of your symptoms as I am limited on work-up that is available, on your exam there was a significant amount of tenderness to your left lower abdomen and the left flank and therefore you will need additional work-up to determine cause if symptoms continue  Your urinalysis was negative for signs of infection or further abnormalities  The x-ray of your abdomen was negative  Please schedule an appointment with your primary doctor for reevaluation of your symptoms, at any point if your symptoms worsen please go to the nearest emergency department for further evaluation and higher level imaging

## 2022-02-16 NOTE — Progress Notes (Signed)
Thanks.  This is urgent.

## 2022-02-16 NOTE — ED Provider Notes (Signed)
[facility]    CSN: [id number] Arrival date & time: 02/16/22  1149      History   Chief Complaint Chief Complaint  Patient presents with   Dysuria    HPI Sativa Gelles is a 36 y.o. female.   Patient presents with dysuria, urgency and left-sided low back pain beginning 1 day ago.  Has increased fluid intake and drink cranberry juice which has been ineffective in minimizing symptoms.  Sexually active, 1 partner, denies concern for STI, endorses she had recent testing.  Last menstrual period 02/03/2022.  Denies frequency, hematuria, lower abdominal pain, fever, chills, vaginal discharge, itching or odor, nausea, vomiting, diarrhea, heartburn or indigestion.  Was recently referred to nephrology due to elevated lab work by PCP.   Past Medical History:  Diagnosis Date   Anemia    Asthma    GERD (gastroesophageal reflux disease)    Hepatitis A    as a teenager    Patient Active Problem List   Diagnosis Date Noted   General weakness 02/10/2022   History of abnormal cervical Papanicolaou smear 10/26/2021   Pruritus of vulva 10/26/2021   Cervical high risk HPV (human papillomavirus) test positive 10/26/2021   Abnormal Pap smear of cervix 10/26/2021   Gastroesophageal reflux disease without esophagitis 10/03/2020   Intermittent asthma 10/03/2020    Past Surgical History:  Procedure Laterality Date   lump removed from left axilla  2007    OB History     Gravida  2   Para  2   Term  2   Preterm      AB      Living  2      SAB      IAB      Ectopic      Multiple      Live Births               Home Medications    Prior to Admission medications   Medication Sig Start Date End Date Taking? Authorizing Provider  albuterol (VENTOLIN HFA) 108 (90 Base) MCG/ACT inhaler Inhale 2 puffs into the lungs every 6 (six) hours as needed for wheezing or shortness of breath. 02/10/22  Yes Sagardia, Eilleen Kempf, MD  pantoprazole (PROTONIX) 40 MG tablet  Take 1 tablet (40 mg total) by mouth daily. Patient not taking: Reported on 10/26/2021 07/30/21   Tressia Danas, MD    Family History Family History  Problem Relation Age of Onset   Diabetes Mother    Heart disease Mother    Hypertension Mother    Cancer Maternal Grandmother        cervical   Diabetes Maternal Grandmother    Heart disease Maternal Grandmother    Migraines Cousin    Colon cancer Neg Hx    Esophageal cancer Neg Hx    Pancreatic cancer Neg Hx    Stomach cancer Neg Hx    Liver disease Neg Hx    Colon polyps Neg Hx    Rectal cancer Neg Hx     Social History Social History   Tobacco Use   Smoking status: Never   Smokeless tobacco: Never  Vaping Use   Vaping Use: Never used  Substance Use Topics   Alcohol use: No    Comment: occasional   Drug use: No     Allergies   Tetracyclines & related and Gadolinium derivatives   Review of Systems Review of Systems  Constitutional: Negative.   Respiratory: Negative.  Cardiovascular: Negative.   Genitourinary:  Positive for dysuria, flank pain and urgency. Negative for decreased urine volume, difficulty urinating, dyspareunia, enuresis, frequency, genital sores, hematuria, menstrual problem, pelvic pain, vaginal bleeding, vaginal discharge and vaginal pain.  Skin: Negative.      Physical Exam Triage Vital Signs ED Triage Vitals  Enc Vitals Group     BP 02/16/22 1257 103/69     Pulse Rate 02/16/22 1257 67     Resp 02/16/22 1257 18     Temp 02/16/22 1257 98.3 F (36.8 C)     Temp Source 02/16/22 1257 Oral     SpO2 02/16/22 1257 100 %     Weight --      Height --      Head Circumference --      Peak Flow --      Pain Score 02/16/22 1255 5     Pain Loc --      Pain Edu? --      Excl. in GC? --    No data found.  Updated Vital Signs BP 103/69 (BP Location: Left Arm)   Pulse 67   Temp 98.3 F (36.8 C) (Oral)   Resp 18   LMP 02/03/2022 (Approximate)   SpO2 100%   Visual Acuity Right Eye  Distance:   Left Eye Distance:   Bilateral Distance:    Right Eye Near:   Left Eye Near:    Bilateral Near:     Physical Exam Constitutional:      Appearance: Normal appearance.  Eyes:     Extraocular Movements: Extraocular movements intact.  Pulmonary:     Effort: Pulmonary effort is normal.  Abdominal:     General: Abdomen is flat. Bowel sounds are normal.     Palpations: Abdomen is soft.     Tenderness: There is abdominal tenderness in the left lower quadrant. There is left CVA tenderness.  Neurological:     Mental Status: She is alert.  Psychiatric:        Mood and Affect: Mood normal.        Behavior: Behavior normal.      UC Treatments / Results  Labs (all labs ordered are listed, but only abnormal results are displayed) Labs Reviewed  POCT URINALYSIS DIPSTICK, ED / UC    EKG   Radiology No results found.  Procedures Procedures (including critical care time)  Medications Ordered in UC Medications - No data to display  Initial Impression / Assessment and Plan / UC Course  I have reviewed the triage vital signs and the nursing notes.  Pertinent labs & imaging results that were available during my care of the patient were reviewed by me and considered in my medical decision making (see chart for details).  Dysuria, urinary urgency, left flank pain  Unknown etiology of symptoms, urinalysis negative, declined STI testing, abdominal x-ray negative, discussed findings with patient, tenderness is noted to the left lower quadrant and left flank with CVA tenderness noted, low suspicion of a kidney stone based on presentation and imaging is so far negative, recommended supportive measures such as Pyridium, over-the-counter analgesics and continued increase fluid intake, advised patient to schedule an appointment with PCP for follow-up and at any point if symptoms worsen she is to go to the nearest emergency department for further evaluation and management, verbalized  understanding Final Clinical Impressions(s) / UC Diagnoses   Final diagnoses:  None   Discharge Instructions   None    ED Prescriptions   None  PDMP not reviewed this encounter.   Valinda Hoar, NP 02/16/22 1410

## 2022-02-16 NOTE — ED Provider Notes (Signed)
[facility] EMERGENCY DEPARTMENT Provider Note   CSN: [id number] Arrival date & time: 02/16/22  2249     History {Add pertinent medical, surgical, social history, OB history to HPI:1} Chief Complaint  Patient presents with   Flank Pain    Amy Gaines is a 36 y.o. female.  The history is provided by the patient and medical records.  Flank Pain  Amy Gaines is a 36 y.o. female who presents to the Emergency Department complaining of *** Sharp left flank pain, worsened yesterday, started two  days ago Comes and goes Has dysuria.   No fever, N/V, discharge.  No prior similar sxs.   Has a hx/o asthma.     Feels fatigued, itchy skin - saw pcp one week ago and had labs performed that showed kidney failure.        Home Medications Prior to Admission medications   Medication Sig Start Date End Date Taking? Authorizing Provider  albuterol (VENTOLIN HFA) 108 (90 Base) MCG/ACT inhaler Inhale 2 puffs into the lungs every 6 (six) hours as needed for wheezing or shortness of breath. 02/10/22   Georgina Quint, MD  pantoprazole (PROTONIX) 40 MG tablet Take 1 tablet (40 mg total) by mouth daily. Patient not taking: Reported on 10/26/2021 07/30/21   Tressia Danas, MD      Allergies    Tetracyclines & related and Gadolinium derivatives    Review of Systems   Review of Systems  Genitourinary:  Positive for flank pain.  All other systems reviewed and are negative.   Physical Exam Updated Vital Signs BP 133/63   Pulse 74   Temp 98.1 F (36.7 C) (Oral)   Resp 16   LMP 02/03/2022 (Approximate)   SpO2 100%  Physical Exam Vitals and nursing note reviewed.  Constitutional:      Appearance: She is well-developed.  HENT:     Head: Normocephalic and atraumatic.  Cardiovascular:     Rate and Rhythm: Normal rate and regular rhythm.  Pulmonary:     Effort: Pulmonary effort is normal. No respiratory distress.  Abdominal:     Palpations:  Abdomen is soft.     Tenderness: There is no guarding or rebound.     Comments: Mild lower abdominal tenderness  Musculoskeletal:        General: No tenderness.  Skin:    General: Skin is warm and dry.  Neurological:     Mental Status: She is alert and oriented to person, place, and time.  Psychiatric:        Behavior: Behavior normal.     ED Results / Procedures / Treatments   Labs (all labs ordered are listed, but only abnormal results are displayed) Labs Reviewed - No data to display  EKG None  Radiology DG Abdomen 1 View  Result Date: 02/16/2022 CLINICAL DATA:  Left-sided flank pain beginning yesterday. EXAM: ABDOMEN - 1 VIEW COMPARISON:  None FINDINGS: Bowel gas pattern is normal. Mild scoliotic curvature of the spine convex to the left. No visible stone disease overlying either kidney or along the course of either ureter. Phleboliths noted in the right pelvis. No bladder stone is seen. IMPRESSION: No visible urinary tract stone disease. Electronically Signed   By: Paulina Fusi M.D.   On: 02/16/2022 13:38    Procedures Procedures  {Document cardiac monitor, telemetry assessment procedure when appropriate:1}  Medications Ordered in ED Medications - No data to display  ED Course/ Medical Decision Making/ A&P  Medical Decision Making  ***  {Document critical care time when appropriate:1} {Document review of labs and clinical decision tools ie heart score, Chads2Vasc2 etc:1}  {Document your independent review of radiology images, and any outside records:1} {Document your discussion with family members, caretakers, and with consultants:1} {Document social determinants of health affecting pt's care:1} {Document your decision making why or why not admission, treatments were needed:1} Final Clinical Impression(s) / ED Diagnoses Final diagnoses:  None    Rx / DC Orders ED Discharge Orders     None

## 2022-02-17 ENCOUNTER — Emergency Department (HOSPITAL_COMMUNITY): Payer: 59

## 2022-02-17 LAB — COMPREHENSIVE METABOLIC PANEL
ALT: 17 U/L (ref 0–44)
AST: 18 U/L (ref 15–41)
Albumin: 4.4 g/dL (ref 3.5–5.0)
Alkaline Phosphatase: 54 U/L (ref 38–126)
Anion gap: 10 (ref 5–15)
BUN: 13 mg/dL (ref 6–20)
CO2: 23 mmol/L (ref 22–32)
Calcium: 9.5 mg/dL (ref 8.9–10.3)
Chloride: 105 mmol/L (ref 98–111)
Creatinine, Ser: 0.76 mg/dL (ref 0.44–1.00)
GFR, Estimated: >60 mL/min (ref >60)
Glucose, Bld: 86 mg/dL (ref 70–99)
Potassium: 4.1 mmol/L (ref 3.5–5.1)
Sodium: 138 mmol/L (ref 135–145)
Total Bilirubin: 0.4 mg/dL (ref 0.3–1.2)
Total Protein: 7.1 g/dL (ref 6.5–8.1)

## 2022-02-17 LAB — URINALYSIS, ROUTINE W REFLEX MICROSCOPIC
Bilirubin Urine: NEGATIVE
Glucose, UA: NEGATIVE mg/dL
Hgb urine dipstick: NEGATIVE
Ketones, ur: 5 mg/dL — AB
Leukocytes,Ua: NEGATIVE
Nitrite: NEGATIVE
Protein, ur: NEGATIVE mg/dL
Specific Gravity, Urine: 1.005 (ref 1.005–1.030)
pH: 5 (ref 5.0–8.0)

## 2022-02-17 LAB — CBC WITH DIFFERENTIAL/PLATELET
Abs Immature Granulocytes: 0.01 10*3/uL (ref 0.00–0.07)
Basophils Absolute: 0.1 10*3/uL (ref 0.0–0.1)
Basophils Relative: 1 %
Eosinophils Absolute: 0.1 10*3/uL (ref 0.0–0.5)
Eosinophils Relative: 2 %
HCT: 41.3 % (ref 36.0–46.0)
Hemoglobin: 13.9 g/dL (ref 12.0–15.0)
Immature Granulocytes: 0 %
Lymphocytes Relative: 44 %
Lymphs Abs: 2.3 10*3/uL (ref 0.7–4.0)
MCH: 29.7 pg (ref 26.0–34.0)
MCHC: 33.7 g/dL (ref 30.0–36.0)
MCV: 88.2 fL (ref 80.0–100.0)
Monocytes Absolute: 0.4 10*3/uL (ref 0.1–1.0)
Monocytes Relative: 7 %
Neutro Abs: 2.5 10*3/uL (ref 1.7–7.7)
Neutrophils Relative %: 46 %
Platelets: 264 10*3/uL (ref 150–400)
RBC: 4.68 MIL/uL (ref 3.87–5.11)
RDW: 12.9 % (ref 11.5–15.5)
WBC: 5.2 10*3/uL (ref 4.0–10.5)
nRBC: 0 % (ref 0.0–0.2)

## 2022-02-17 LAB — URINE CULTURE: Culture: NO GROWTH

## 2022-02-17 LAB — LIPASE, BLOOD: Lipase: 35 U/L (ref 11–51)

## 2022-02-17 LAB — I-STAT BETA HCG BLOOD, ED (MC, WL, AP ONLY): I-stat hCG, quantitative: <5 m[IU]/mL (ref <5)

## 2022-02-17 NOTE — ED Notes (Signed)
DC instructions reviewed with pt. PT verbalized understanding. PT DC °

## 2022-02-17 NOTE — Progress Notes (Signed)
Thank you :)

## 2022-02-17 NOTE — ED Notes (Signed)
Patient transported to CT 

## 2022-08-16 ENCOUNTER — Ambulatory Visit: Payer: Commercial Managed Care - PPO | Admitting: Emergency Medicine

## 2022-08-19 NOTE — Progress Notes (Signed)
Subjective:    Patient ID: Amy Gaines, female    DOB: 1985-08-24, 37 y.o.   MRN: [id number]      HPI Josey is here for  Chief Complaint  Patient presents with   Chest Pain    Pain starts from bottom of stomach and goes all the way up into throat; Patient last had pain on Wednesday (very fatigue); Chest feels tight at times     Started having symptoms > 1 year ago but now it is more frequent.   One time she was driving.  Pain started in mid chest - moves up to throat, to stomach, to right arm.  It lasted 15 min.  The pain can last 3-15 min.    The other day she felt cold sweat, nausea, lightheaded when she was paying for groceries.  Occasionally has palpitations. It occurs randomly. She feels like she has to take a deep breath, burning sensation in chest.  Lasts a few seconds.   She feels chest tightness all the time.  She feesl like she has to take a deep breath frequently.   Eats regularly during day.  Drinks a good amount of water during day.  No pattern to when above symptoms occur.  One cup of coffee in am.   Exercise - she walks 30 minutes - no symptoms when walking.    Medications and allergies reviewed with patient and updated if appropriate.  Current Outpatient Medications on File Prior to Visit  Medication Sig Dispense Refill   albuterol (VENTOLIN HFA) 108 (90 Base) MCG/ACT inhaler Inhale 2 puffs into the lungs every 6 (six) hours as needed for wheezing or shortness of breath. 1 each 3   No current facility-administered medications on file prior to visit.    Review of Systems  Constitutional:  Negative for appetite change, chills and fever.  Respiratory:  Negative for cough, shortness of breath (when sitting - feels like she has to take a deep breath a few times) and wheezing.   Cardiovascular:  Positive for chest pain and palpitations. Negative for leg swelling.  Gastrointestinal:  Positive for constipation. Negative for abdominal pain.       Gerd -  1-2 times a month- different than other chest pain  Neurological:  Positive for light-headedness (with palpitations) and numbness (L > right hand). Negative for headaches.       Objective:   Vitals:   08/20/22 0906  BP: 100/72  Pulse: 66  Temp: 98.6 F (37 C)  SpO2: 99%   BP Readings from Last 3 Encounters:  08/20/22 100/72  02/17/22 98/60  02/16/22 103/69   Wt Readings from Last 3 Encounters:  08/20/22 141 lb (64 kg)  02/10/22 143 lb 4 oz (65 kg)  10/26/21 147 lb 12.8 oz (67 kg)   Body mass index is 23.46 kg/m.    Physical Exam Constitutional:      General: She is not in acute distress.    Appearance: Normal appearance.  HENT:     Head: Normocephalic and atraumatic.  Eyes:     Conjunctiva/sclera: Conjunctivae normal.  Cardiovascular:     Rate and Rhythm: Normal rate and regular rhythm.     Heart sounds: Normal heart sounds.  Pulmonary:     Effort: Pulmonary effort is normal. No respiratory distress.     Breath sounds: Normal breath sounds. No wheezing.  Abdominal:     General: Abdomen is flat. There is no distension.     Palpations: Abdomen is  soft.     Tenderness: There is no abdominal tenderness. There is no guarding or rebound.  Musculoskeletal:     Cervical back: Neck supple.     Right lower leg: No edema.     Left lower leg: No edema.  Lymphadenopathy:     Cervical: No cervical adenopathy.  Skin:    General: Skin is warm and dry.     Findings: No rash.  Neurological:     Mental Status: She is alert. Mental status is at baseline.  Psychiatric:        Mood and Affect: Mood normal.        Behavior: Behavior normal.            Assessment & Plan:    Chest pain: Has been having episodes of chest pain for over a year, but symptoms are becoming more frequent Pain starts in the chest-moves radiates to stomach and up to the throat.  On 1 occasion she had pain in her right arm. Pain last 3-15 minutes and then resolves Can occur at rest-does not  seem to be associated with activity or any pattern that she has been able to identify EKG today-NSR at 66 bpm, normal EKG.  No prior EKG for comparison Will check CBC, CMP, TSH Referral to cardiology   Palpitations: Occasional transient palpitations that may last a few seconds Occurs randomly Associated with needing to take a deep breath and a burning sensation in her chest Drinks 1 cup of coffee a day-no other caffeine EKG today-NSR at 66 bpm, normal EKG.  No prior EKG for comparison Will check CBC, CMP, TSH Referral to cardiology   GERD: Has intermittent GERD-1-2 times a month at this point Takes an over-the-counter nonacid History of H. pylori, which was treated No epigastric pain Will try Pepcid 40 mg daily for 2-4 weeks to see if this improves any of her symptoms-if not can take as needed

## 2022-08-20 ENCOUNTER — Ambulatory Visit: Payer: Commercial Managed Care - PPO | Admitting: Internal Medicine

## 2022-08-20 ENCOUNTER — Encounter: Payer: Self-pay | Admitting: Internal Medicine

## 2022-08-20 VITALS — BP 100/72 | HR 66 | Temp 98.6°F | Ht 65.0 in | Wt 141.0 lb

## 2022-08-20 DIAGNOSIS — K219 Gastro-esophageal reflux disease without esophagitis: Secondary | ICD-10-CM

## 2022-08-20 DIAGNOSIS — R002 Palpitations: Secondary | ICD-10-CM | POA: Diagnosis not present

## 2022-08-20 DIAGNOSIS — R0789 Other chest pain: Secondary | ICD-10-CM

## 2022-08-20 LAB — COMPREHENSIVE METABOLIC PANEL
ALT: 17 U/L (ref 0–35)
AST: 16 U/L (ref 0–37)
Albumin: 4.3 g/dL (ref 3.5–5.2)
Alkaline Phosphatase: 56 U/L (ref 39–117)
BUN: 12 mg/dL (ref 6–23)
CO2: 27 mEq/L (ref 19–32)
Calcium: 9.4 mg/dL (ref 8.4–10.5)
Chloride: 104 mEq/L (ref 96–112)
Creatinine, Ser: 0.67 mg/dL (ref 0.40–1.20)
GFR: 110.59 mL/min (ref >60.00)
Glucose, Bld: 90 mg/dL (ref 70–99)
Potassium: 4.5 mEq/L (ref 3.5–5.1)
Sodium: 136 mEq/L (ref 135–145)
Total Bilirubin: 0.3 mg/dL (ref 0.2–1.2)
Total Protein: 7.3 g/dL (ref 6.0–8.3)

## 2022-08-20 LAB — CBC WITH DIFFERENTIAL/PLATELET
Basophils Absolute: 0 10*3/uL (ref 0.0–0.1)
Basophils Relative: 0.6 % (ref 0.0–3.0)
Eosinophils Absolute: 0 10*3/uL (ref 0.0–0.7)
Eosinophils Relative: 0.5 % (ref 0.0–5.0)
HCT: 42 % (ref 36.0–46.0)
Hemoglobin: 14.1 g/dL (ref 12.0–15.0)
Lymphocytes Relative: 31.2 % (ref 12.0–46.0)
Lymphs Abs: 1.6 10*3/uL (ref 0.7–4.0)
MCHC: 33.5 g/dL (ref 30.0–36.0)
MCV: 88.5 fl (ref 78.0–100.0)
Monocytes Absolute: 0.3 10*3/uL (ref 0.1–1.0)
Monocytes Relative: 6 % (ref 3.0–12.0)
Neutro Abs: 3.2 10*3/uL (ref 1.4–7.7)
Neutrophils Relative %: 61.7 % (ref 43.0–77.0)
Platelets: 257 10*3/uL (ref 150.0–400.0)
RBC: 4.75 Mil/uL (ref 3.87–5.11)
RDW: 13.7 % (ref 11.5–15.5)
WBC: 5.2 10*3/uL (ref 4.0–10.5)

## 2022-08-20 LAB — TSH: TSH: 2.07 u[IU]/mL (ref 0.35–5.50)

## 2022-08-20 MED ORDER — FAMOTIDINE 40 MG PO TABS: 40.0000 mg | ORAL_TABLET | Freq: Every day | ORAL | 1 refills | Status: DC

## 2022-08-20 NOTE — Patient Instructions (Addendum)
     An EKG was done today.    Blood work was ordered.   The lab is on the first floor.    Medications changes include :   start pepcid 40 mg daily for 2-4 weeks - then try taking only as needed.     A referral was ordered for Cardiology.     Someone will call you to schedule an appointment.     Return if symptoms worsen or fail to improve.

## 2022-09-06 DIAGNOSIS — Z87898 Personal history of other specified conditions: Secondary | ICD-10-CM

## 2022-09-06 HISTORY — DX: Personal history of other specified conditions: Z87.898

## 2022-09-16 DIAGNOSIS — J45909 Unspecified asthma, uncomplicated: Secondary | ICD-10-CM | POA: Insufficient documentation

## 2022-09-16 DIAGNOSIS — B159 Hepatitis A without hepatic coma: Secondary | ICD-10-CM | POA: Insufficient documentation

## 2022-09-16 DIAGNOSIS — D649 Anemia, unspecified: Secondary | ICD-10-CM | POA: Insufficient documentation

## 2022-09-24 ENCOUNTER — Encounter: Payer: Self-pay | Admitting: Cardiology

## 2022-09-24 ENCOUNTER — Ambulatory Visit: Payer: Commercial Managed Care - PPO | Attending: Cardiology | Admitting: Cardiology

## 2022-09-24 ENCOUNTER — Ambulatory Visit: Payer: Commercial Managed Care - PPO | Attending: Cardiology

## 2022-09-24 VITALS — BP 108/68 | HR 71 | Ht 65.0 in | Wt 141.4 lb

## 2022-09-24 DIAGNOSIS — R002 Palpitations: Secondary | ICD-10-CM | POA: Insufficient documentation

## 2022-09-24 DIAGNOSIS — R079 Chest pain, unspecified: Secondary | ICD-10-CM | POA: Diagnosis not present

## 2022-09-24 NOTE — Progress Notes (Signed)
Cardiology Office Note:    Date:  09/24/2022   ID:  Amy Gaines, DOB [birth year], MRN [id number]  PCP:  Amy Quint, MD  Cardiologist:  Garwin Brothers, MD   Referring MD: Amy Gaines, *    ASSESSMENT:    1. Chest pain of uncertain etiology   2. Palpitations    PLAN:    In order of problems listed above:  Primary prevention stressed to the patient.  Importance of compliance with diet medication stressed and she vocalized understanding. Chest pain: This is concerning to her and therefore we will do an exercise stress echo and she is agreeable.  This will reassure her. Palpitations: Appears benign.  No dizziness or syncope.  Will do a 2-week monitor.  She mentions to me that she gets all her blood work done by primary care. Patient will be seen in follow-up appointment in 12 months or earlier if the patient has any concerns.    Medication Adjustments/Labs and Tests Ordered: Current medicines are reviewed at length with the patient today.  Concerns regarding medicines are outlined above.  Orders Placed This Encounter  Procedures   LONG TERM MONITOR (3-14 DAYS)   EKG 12-Lead   ECHOCARDIOGRAM STRESS TEST   No orders of the defined types were placed in this encounter.    History of Present Illness:    Amy Gaines is a 37 y.o. female who is being seen today for the evaluation of chest pressure and palpitations at the request of Amy Gaines, *.  Patient is a pleasant 37 year old female.  She has past medical history that is significant.  No history of hypertension dyslipidemia or diabetes mellitus.  Patient mentions to me that she occasionally has chest pressure.  She walks on a regular basis.  She does about 30 minutes without any problems.  But the symptoms have concerned her.  At the time of my evaluation, the patient is alert awake oriented and in no distress.  Her lipids from last year is overall unremarkable.  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   famotidine (PEPCID) 40 MG tablet Take 1 tablet (40 mg total) by mouth daily.     Allergies:   Tetracyclines & related and Gadolinium derivatives   Social History   Socioeconomic History   Marital status: Married    Spouse name: Not on file   Number of children: 2   Years of education: Not on file   Highest education level: High school graduate  Occupational History   Not on file  Tobacco Use   Smoking status: Never   Smokeless tobacco: Never  Vaping Use   Vaping Use: Never used  Substance and Sexual Activity   Alcohol use: No    Comment:  occasional   Drug use: No   Sexual activity: Yes    Birth control/protection: Condom  Other Topics Concern   Not on file  Social History Narrative   Lives at home with her husband and 2 children   Right handed   Caffeine: "16 oz a day maybe"   Social Determinants of Corporate investment banker Strain: Not on file  Food Insecurity: Not on file  Transportation Needs: Not on file  Physical Activity: Not on file  Stress: Not on file  Social Connections: Not on file     Family History: The patient's family history includes Cancer in her maternal grandmother; Diabetes in her maternal grandmother and mother; Heart disease in  her maternal grandmother and mother; Hypertension in her mother; Migraines in her cousin. There is no history of Colon cancer, Esophageal cancer, Pancreatic cancer, Stomach cancer, Liver disease, Colon polyps, or Rectal cancer.  ROS:   Please see the history of present illness.    All other systems reviewed and are negative.  EKGs/Labs/Other Studies Reviewed:    The following studies were reviewed today: EKG reveals sinus rhythm and nonspecific ST-T changes   Recent Labs: 08/20/2022: ALT 17; BUN 12; Creatinine, Ser 0.67; Hemoglobin 14.1; Platelets 257.0; Potassium 4.5; Sodium 136; TSH 2.07  Recent Lipid Panel    Component Value Date/Time   CHOL 175 10/26/2021 0827   CHOL 183 11/18/2017 0944   TRIG 77.0 10/26/2021 0827   HDL 58.20 10/26/2021 0827   HDL 43 11/18/2017 0944   CHOLHDL 3 10/26/2021 0827   VLDL 15.4 10/26/2021 0827   LDLCALC 101 (H) 10/26/2021 0827   LDLCALC 101 (H) 11/18/2017 0944    Physical Exam:    VS:  BP 108/68   Pulse 71   Ht  (1.651 m)   Wt 141 lb 6.4 oz (64.1 kg)   SpO2 99%   BMI 23.53 kg/m     Wt Readings from Last 3 Encounters:  09/24/22 141 lb 6.4 oz (64.1 kg)  08/20/22 141 lb (64 kg)  02/10/22 143 lb 4 oz (65 kg)     GEN: Patient is in no acute distress HEENT: Normal NECK: No JVD; No carotid bruits LYMPHATICS: No lymphadenopathy CARDIAC: S1 S2 regular, 2/6 systolic murmur at the apex. RESPIRATORY:  Clear to auscultation without rales, wheezing or rhonchi  ABDOMEN: Soft, non-tender, non-distended MUSCULOSKELETAL:  No edema; No deformity  SKIN: Warm and dry NEUROLOGIC:  Alert and oriented x 3 PSYCHIATRIC:  Normal affect    Signed, Garwin Brothers, MD  09/24/2022 10:10 AM    [facility]

## 2022-09-24 NOTE — Patient Instructions (Signed)
Medication Instructions:  Your physician recommends that you continue on your current medications as directed. Please refer to the Current Medication list given to you today.  *If you need a refill on your cardiac medications before your next appointment, please call your pharmacy*   Lab Work: None If you have labs (blood work) drawn today and your tests are completely normal, you will receive your results only by: MyChart Message (if you have MyChart) OR A paper copy in the mail If you have any lab test that is abnormal or we need to change your treatment, we will call you to review the results.   Testing/Procedures: A zio monitor was ordered today. It will remain on for 14 days. You will then return monitor and event diary in provided box. It takes 1-2 weeks for report to be downloaded and returned to [place]. We will call you with the results. If monitor falls off or has orange flashing light, please call Zio for further instructions.   Your physician has requested that you have a stress echocardiogram. For further information please visit [email or web address] Please follow instruction sheet as given.    Follow-Up: At [facility], you and your health needs are our priority.  As part of our continuing mission to provide you with exceptional heart care, we have created designated Provider Care Teams.  These Care Teams include your primary Cardiologist (physician) and Advanced Practice Providers (APPs -  Physician Assistants and Nurse Practitioners) who all work together to provide you with the care you need, when you need it.  We recommend signing up for the patient portal called "MyChart".  Sign up information is provided on this After Visit Summary.  MyChart is used to connect with patients for Virtual Visits (Telemedicine).  Patients are able to view lab/test results, encounter notes, upcoming appointments, etc.  Non-urgent messages can be sent to your provider as well.   To learn  more about what you can do with MyChart, go to [email or web address].    Your next appointment:   1 year(s)  Provider:   Belva Crome, MD    Other Instructions None

## 2022-09-27 ENCOUNTER — Encounter: Payer: Self-pay | Admitting: Emergency Medicine

## 2022-09-27 ENCOUNTER — Ambulatory Visit: Payer: Commercial Managed Care - PPO | Admitting: Emergency Medicine

## 2022-09-27 VITALS — BP 114/72 | HR 67 | Temp 98.0°F | Ht 65.0 in | Wt 141.5 lb

## 2022-09-27 DIAGNOSIS — R1013 Epigastric pain: Secondary | ICD-10-CM | POA: Diagnosis not present

## 2022-09-27 DIAGNOSIS — F439 Reaction to severe stress, unspecified: Secondary | ICD-10-CM | POA: Insufficient documentation

## 2022-09-27 DIAGNOSIS — G4700 Insomnia, unspecified: Secondary | ICD-10-CM | POA: Insufficient documentation

## 2022-09-27 MED ORDER — ZOLPIDEM TARTRATE 5 MG PO TABS: 5.0000 mg | ORAL_TABLET | Freq: Every evening | ORAL | 1 refills | Status: DC | PRN

## 2022-09-27 NOTE — Patient Instructions (Signed)
Estrs en los adultos Stress, Adult El estrs es una reaccin normal a los sucesos de la vida. Es lo que se siente cuando la vida le exige ms de lo que est acostumbrado o ms de lo que cree que puede manejar. Un poco de estrs puede ser til, por ejemplo, cuando se estudia para una prueba o se debe cumplir con una fecha lmite para un trabajo. El estrs muy frecuente o que dura mucho tiempo puede causar problemas. El estrs prolongado se denomina estrs crnico. El estrs crnico puede afectar la salud emocional y dificultar las relaciones y las actividades cotidianas normales. El exceso de estrs puede debilitar el sistema de defensa del cuerpo (sistema inmunitario) y aumentar el riesgo de tener enfermedades fsicas. Si ya tiene un problema mdico, el estrs puede empeorarlo. Cules son las causas? Todos los sucesos de la vida pueden causar estrs. Un suceso que le causa estrs a una persona puede no ser estresante para otra. Los sucesos ms importantes de la vida, sean positivos o negativos, suelen causar estrs. Por ejemplo: Perder un trabajo o empezar un trabajo nuevo. Perder a un ser querido. Mudarse a una casa o un barrio nuevos. Casarse o divorciarse. Tener un beb. Lesionarse o enfermarse. Los sucesos de la vida menos evidentes tambin pueden causar estrs, especialmente si ocurren da tras da o combinados entre s. Por ejemplo: Trabajar muchas horas. Conducir en medio del trfico. Cuidar de los nios. Tener deudas. Estar en una relacin difcil. Cules son los signos o sntomas? El estrs puede causar sntomas emocionales y fsicos y puede dar lugar a conductas poco saludables. Estos incluyen los siguientes: Sntomas emocionales Ansiedad. Sentirse preocupado, temeroso, nervioso, abrumado o fuera de control. Enojo, incluso irritacin o impaciencia. Depresin. Sentirse triste, decado, desesperanzado o culpable. Dificultad para concentrarse, recordar o tomar decisiones. Sntomas  fsicos Dolores y molestias. Estos pueden afectar la cabeza, el cuello, la espalda, el estmago u otras zonas del cuerpo. Rigidez muscular o tensin mandibular. Poca energa. Dificultad para dormir. Conductas poco saludables [place] para sentirse mejor (comer en exceso) o saltear comidas. Trabajar mucho o postergar tareas. Fumar, beber alcohol o consumir drogas para sentirse mejor. Cmo se diagnostica? El trastorno por estrs se diagnostica a travs de una evaluacin que realiza el mdico. Un trastorno por estrs puede diagnosticarse en funcin de lo siguiente: Sus sntomas y cualquier suceso estresante que le haya ocurrido en la vida. Sus antecedentes mdicos. Estudios para descartar otras causas de los sntomas. Segn la afeccin, el mdico podr derivarlo a un especialista para que le haga ms evaluaciones. Cmo se trata?  El tratamiento recomendado para el estrs son las tcnicas de control del estrs. Generalmente, no se recomiendan medicamentos para tratar el estrs. Entre las tcnicas para reducir su respuesta a los sucesos estresantes de la vida, se incluyen las siguientes: Identificar el estrs. Obsrvese a usted mismo para detectar sntomas de estrs e identificar sus causas. Estas habilidades pueden ayudarlo a evitar sucesos estresantes o a prepararse para ellos. Gestionar el tiempo. Establezca las prioridades, lleve un calendario de los sucesos y aprenda a decir "no". Estas acciones pueden ayudarle a evitar asumir demasiado. Las tcnicas para lidiar con el estrs incluyen lo siguiente: Replantearse el problema. Trate de pensar de un modo realista los eventos estresantes en lugar de ignorarlos o tener reacciones exageradas. Intente encontrar los aspectos positivos en una situacin estresante, en lugar de enfocarse en los negativos. Actividad fsica. El ejercicio fsico puede liberar la tensin fsica y emocional. La clave es encontrar un   tipo de ejercicio fsico que disfrute y  practique con regularidad. Tcnicas de relajacin. Estas relajan el cuerpo y la mente. Encuentre una o ms opciones que disfrute y practquela con regularidad. Por ejemplo: Meditacin, respiracin profunda o tcnicas de relajacin progresiva. Yoga o tai chi. Biorretroalimentacin, tcnicas de plena conciencia o llevar un diario. Escuchar msica, estar en contacto con la naturaleza o participar en otros pasatiempos. Llevar un estilo de vida saludable. Siga una dieta equilibrada, tome mucha agua, limite o evite la cafena y duerma mucho. Tener una red de apoyo fuerte. Pase tiempo con la familia, los amigos y otras personas cuya compaa disfrute. Exprese sus sentimientos y converse acerca de las cosas que le preocupan con alguien en quien confe. La orientacin o la psicoterapia con un profesional de salud mental pueden ser de ayuda si tiene dificultades para controlar el estrs usted solo. Siga estas instrucciones en su casa: Estilo de vida  Evite las drogas. No consuma ningn producto que contenga nicotina o tabaco. Estos productos incluyen cigarrillos, tabaco para mascar y aparatos de vapeo, como los cigarrillos electrnicos. Si necesita ayuda para dejar de fumar, consulte al mdico. Si bebe alcohol: Limite la cantidad que bebe a lo siguiente: De 0 a 1 medida por da para las mujeres que no estn embarazadas. De 0 a 2 medidas por da para los hombres. Sepa cunta cantidad de alcohol hay en las bebidas. En los [place], una medida equivale a una botella de cerveza de 12 oz (355 ml), un vaso de vino de 5 oz (148 ml) o un vaso de una bebida alcohlica de alta graduacin de 1 oz (44 ml). No consuma alcohol ni drogas para relajarse. Lleve una dieta equilibrada que incluya frutas y verduras frescas, cereales integrales, carnes magras, pescado, huevos, frijoles y lcteos descremados. Evite los alimentos procesados y los alimentos con alto contenido de grasa, azcar y sal. Haga al menos 30  minutos de ejercicio 5 o ms das de la semana. Intente dormir de 7 a 8 horas todas las noches. Instrucciones generales  Practique tcnicas de control del estrs como se lo haya indicado el mdico. Beba suficiente lquido como para mantener la orina de color amarillo plido. Use los medicamentos de venta libre y los recetados solamente como se lo haya indicado el mdico. Concurra a todas las visitas de seguimiento. Esto es importante. Comunquese con un mdico si: Sus sntomas empeoran. Aparecen nuevos sntomas. Se siente abrumado por los problemas y ya no puede manejarlos solo. Solicite ayuda de inmediato si: Piensa acerca de lastimarse a usted mismo o a otras personas. Busque ayuda de inmediatosi alguna vez siente que puede hacerse dao a usted mismo o a otros, o tiene pensamientos de poner fin a su vida. Dirjase al centro de urgencias ms cercano o: Llame al 911. Llame a National Suicide Prevention Lifeline (Lnea Telefnica Nacional para la Prevencin del Suicidio) al 1-800-273-8255 o al 988. Est disponible las 24 horas del da. Enve un mensaje de texto a la lnea para casos de crisis al [id number]. Resumen El estrs es una reaccin normal a los sucesos de la vida. Puede causar problemas si es muy frecuente o dura mucho tiempo. La mejor forma de tratar el estrs es practicar las tcnicas de control del estrs. La orientacin o la psicoterapia con un profesional de salud mental pueden ser de ayuda si tiene dificultades para controlar el estrs usted solo. Esta informacin no tiene como fin reemplazar el consejo del mdico. Asegrese de hacerle al mdico cualquier pregunta   que tenga. Document Revised: 02/04/2021 Document Reviewed: 02/04/2021 Elsevier Patient Education  2023 [employer].  

## 2022-09-27 NOTE — Assessment & Plan Note (Signed)
Contributing to symptoms. Active and affecting quality of life Over-the-counter medications not helping Recommend to try Ambien 5 mg at bedtime

## 2022-09-27 NOTE — Progress Notes (Signed)
Amy Gaines 37 y.o.   Chief Complaint  Patient presents with   Chest Pain    Patient states she has some pain in her left rib area , upper abd area     HISTORY OF PRESENT ILLNESS: This is a 37 y.o. female complaining of burning pain to epigastric area for several weeks On and off without associated symptoms.  Denies melena or rectal bleeding Does not take NSAIDs on a regular basis.  Non-smoker.  No EtOH user. Has appointment to see GI Dr. May 21st Complaining of lack of sleep and general tiredness  HPI   Prior to Admission medications   Medication Sig Start Date End Date Taking? Authorizing Provider  albuterol (VENTOLIN HFA) 108 (90 Base) MCG/ACT inhaler Inhale 2 puffs into the lungs every 6 (six) hours as needed for wheezing or shortness of breath. 02/10/22  Yes Amor Hyle, Eilleen Kempf, MD  famotidine (PEPCID) 40 MG tablet Take 1 tablet (40 mg total) by mouth daily. 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 (human papillomavirus) test positive 10/26/2021   Abnormal Pap smear of cervix 10/26/2021   Gastroesophageal reflux disease without esophagitis 10/03/2020   Intermittent asthma 10/03/2020    Past Medical History:  Diagnosis Date   Abnormal Pap smear of cervix 10/26/2021   Anemia    Asthma    Cervical high risk HPV (human papillomavirus) test positive 10/26/2021   Gastroesophageal reflux disease without esophagitis 10/03/2020   General weakness 02/10/2022   Hepatitis A    as a teenager   History of abnormal cervical Papanicolaou smear 10/26/2021   Intermittent  asthma 10/03/2020   Pruritus of vulva 10/26/2021    Past Surgical History:  Procedure Laterality Date   lump removed from left axilla  2007    Social History   Socioeconomic History   Marital status: Married    Spouse name: Not on file   Number of children: 2   Years of education: Not on file   Highest education level: High school graduate  Occupational History   Not on file  Tobacco Use   Smoking status: Never   Smokeless tobacco: Never  Vaping Use   Vaping Use: Never used  Substance and Sexual Activity   Alcohol use: No    Comment: occasional   Drug use: No   Sexual activity: Yes    Birth control/protection: Condom  Other Topics Concern   Not on file  Social History Narrative   Lives at home with her husband and 2 children   Right handed   Caffeine: "16 oz a day maybe"   Social Determinants of Corporate investment banker Strain: Not on file  Food Insecurity: Not on file  Transportation Needs: Not on file  Physical Activity: Not on file  Stress: Not on file  Social Connections: Not on file  Intimate Partner Violence: Not on file    Family History  Problem Relation Age of Onset   Diabetes Mother    Heart disease Mother    Hypertension Mother  Cancer Maternal Grandmother        cervical   Diabetes Maternal Grandmother    Heart disease Maternal Grandmother    Migraines Cousin    Colon cancer Neg Hx    Esophageal cancer Neg Hx    Pancreatic cancer Neg Hx    Stomach cancer Neg Hx    Liver disease Neg Hx    Colon polyps Neg Hx    Rectal cancer Neg Hx      Review of Systems  Constitutional: Negative.  Negative for chills and fever.  HENT: Negative.  Negative for congestion and sore throat.   Respiratory: Negative.  Negative for cough and shortness of breath.   Cardiovascular: Negative.  Negative for chest pain and palpitations.  Gastrointestinal:  Positive for abdominal pain.  Genitourinary: Negative.  Negative for dysuria and hematuria.  Skin:  Negative.  Negative for rash.  Neurological: Negative.  Negative for dizziness and headaches.  Psychiatric/Behavioral:  The patient has insomnia.   All other systems reviewed and are negative.   Vitals:   09/27/22 1320  BP: 114/72  Pulse: 67  Temp: 98 F (36.7 C)  SpO2: 100%    Physical Exam Constitutional:      Appearance: Normal appearance.  HENT:     Head: Normocephalic.  Eyes:     Extraocular Movements: Extraocular movements intact.     Pupils: Pupils are equal, round, and reactive to light.  Cardiovascular:     Rate and Rhythm: Normal rate and regular rhythm.     Pulses: Normal pulses.     Heart sounds: Normal heart sounds.  Pulmonary:     Effort: Pulmonary effort is normal.     Breath sounds: Normal breath sounds.  Abdominal:     Palpations: Abdomen is soft.     Tenderness: There is abdominal tenderness (Epigastric area).  Musculoskeletal:     Cervical back: No tenderness.  Lymphadenopathy:     Cervical: No cervical adenopathy.  Skin:    General: Skin is warm and dry.     Capillary Refill: Capillary refill takes less than 2 seconds.  Neurological:     General: No focal deficit present.     Mental Status: She is alert and oriented to person, place, and time.  Psychiatric:        Mood and Affect: Mood normal.        Behavior: Behavior normal.      ASSESSMENT & PLAN: A total of 42 minutes was spent with the patient and counseling/coordination of care regarding preparing for this visit, review of most recent office visit notes, review of chronic medical conditions, review of all medications, stress management, insomnia management and sleep hygiene, differential diagnosis of epigastric pain and need for GI evaluation and possible upper endoscopy, education on nutrition, prognosis, documentation, and need for follow-up.  Problem List Items Addressed This Visit       Other   Stress    Stress management discussed. Contributing to most of her symptoms.       Insomnia    Contributing to symptoms. Active and affecting quality of life Over-the-counter medications not helping Recommend to try Ambien 5 mg at bedtime      Relevant Medications   zolpidem (AMBIEN) 5 MG tablet   Epigastric pain - Primary    Active and affecting quality of life. Continues taking Pepcid 40 mg daily Scheduled to see GI doctor next month Diet and nutrition discussed Differential diagnosis discussed Clinical stable.  No red flag signs or  symptoms.      Patient Instructions  Estrs en los adultos Stress, Adult El estrs es una reaccin normal a los sucesos de la vida. Es lo que se siente cuando la vida le exige ms de lo que est acostumbrado o ms de lo que cree que puede [place]. Un poco de estrs puede ser til, por ejemplo, cuando se estudia para una prueba o se debe cumplir con una fecha lmite para un trabajo. El estrs muy frecuente o que dura mucho tiempo puede causar problemas. El estrs prolongado se denomina estrs crnico. El estrs crnico puede afectar la salud emocional y dificultar las relaciones y las actividades cotidianas normales. El exceso de estrs puede Investment banker, corporate sistema de defensa del cuerpo (sistema inmunitario) y aumentar el riesgo de tener enfermedades fsicas. Si ya tiene un problema mdico, el estrs puede empeorarlo. Cules son las causas? Todos los sucesos de la vida pueden causar estrs. Un suceso que le causa estrs a una persona puede no ser estresante para Educational psychologist. Los sucesos ms importantes de la vida, sean positivos o negativos, suelen causar estrs. Por ejemplo: Perder un trabajo o empezar un [place]. Perder a un ser querido. Mudarse a una casa o un barrio [place]. Casarse o divorciarse. Tener un beb. Lesionarse o enfermarse. Los sucesos de la vida menos evidentes tambin pueden causar estrs, especialmente si ocurren da tras da o combinados entre s. Por ejemplo: Trabajar muchas horas. Conducir en medio del  trfico. Cuidar de los nios. Tener deudas. Estar en una relacin difcil. Cules son los signos o sntomas? El estrs puede causar sntomas emocionales y fsicos y puede dar Environmental consultant a conductas poco saludables. Estos incluyen los siguientes: Sntomas emocionales Ansiedad. Sentirse preocupado, temeroso, nervioso, abrumado o fuera de control. Enojo, incluso irritacin o impaciencia. Depresin. Sentirse triste, decado, desesperanzado o culpable. Dificultad para concentrarse, recordar o tomar decisiones. Sntomas fsicos [place]. Estos pueden afectar la cabeza, el cuello, la espalda, el estmago u otras zonas del cuerpo. Rigidez muscular o tensin mandibular. Poca energa. Dificultad para dormir. Conductas poco saludables [place] para sentirse mejor (comer en exceso) o saltear comidas. Trabajar mucho o postergar tareas. Fumar, beber alcohol o consumir drogas para sentirse mejor. Cmo se diagnostica? El trastorno por estrs se diagnostica a travs de una evaluacin que realiza el mdico. Un trastorno por estrs puede diagnosticarse en funcin de lo siguiente: Sus sntomas y cualquier suceso estresante que le haya ocurrido en la vida. Sus antecedentes mdicos. Estudios para Teacher, early years/pre causas de los sntomas. Segn la afeccin, el mdico podr derivarlo a un especialista para que le haga ms evaluaciones. Cmo se trata?  El tratamiento recomendado para el estrs son las tcnicas de control del estrs. Generalmente, no se recomiendan medicamentos para tratar el estrs. Entre las tcnicas para reducir su respuesta a los sucesos estresantes de la vida, se incluyen las siguientes: Press photographer. Obsrvese a usted mismo para detectar sntomas de estrs e identificar sus causas. Estas habilidades pueden ayudarlo a evitar sucesos estresantes o a prepararse para ellos. Leisure centre manager. Establezca las prioridades, lleve un calendario de los sucesos y aprenda a Designer, jewellery "no".  Estas acciones pueden ayudarle a evitar asumir demasiado. Las tcnicas para lidiar con el estrs incluyen lo siguiente: Replantearse el problema. Trate de pensar de un modo Forensic psychologist de ignorarlos o Lawyer. Intente encontrar los aspectos positivos en una situacin estresante, en lugar de enfocarse en los negativos. Actividad fsica. El ejercicio fsico puede Museum/gallery conservator  la tensin fsica y emocional. La clave es encontrar un tipo de ejercicio fsico que disfrute y practique con regularidad. Tcnicas de relajacin. Estas relajan el cuerpo y la [employer]. Encuentre una o ms opciones que disfrute y practquela con regularidad. Por ejemplo: Meditacin, respiracin profunda o tcnicas de relajacin progresiva. Yoga o tai chi. Biorretroalimentacin, tcnicas de plena conciencia o llevar un diario. Escuchar msica, estar en contacto con la naturaleza o participar en otros pasatiempos. Llevar un estilo de vida saludable. Siga una dieta equilibrada, tome mucha agua, limite o evite la cafena y [place]. Tener una red de [employer]. Pase tiempo con la familia, los amigos y otras personas cuya compaa disfrute. Exprese sus sentimientos y converse acerca de las cosas que le preocupan con alguien en quien confe. La orientacin o la psicoterapia con un profesional de salud mental pueden ser de ayuda si tiene dificultades para controlar el estrs usted solo. Siga estas instrucciones en su casa: Estilo de vida  Evite las drogas. No consuma ningn producto que contenga nicotina o tabaco. Estos productos incluyen cigarrillos, tabaco para Theatre manager y aparatos de vapeo, como los Administrator, Civil Service. Si necesita ayuda para dejar de fumar, consulte al mdico. Si bebe alcohol: Limite la cantidad que bebe a lo siguiente: De 0 a 1 medida por da para las mujeres que no estn embarazadas. De 0 a 2 medidas por da para los hombres. Sepa cunta cantidad de alcohol hay  en las bebidas. En los [address], una medida equivale a una botella de cerveza de 12 oz (355 ml), un vaso de vino de 5 oz (148 ml) o un vaso de una bebida alcohlica de alta graduacin de 1 oz (44 ml). No consuma alcohol ni drogas para relajarse. Lleve una dieta equilibrada que incluya frutas y verduras frescas, cereales integrales, carnes magras, pescado, huevos, frijoles y lcteos descremados. Evite los alimentos procesados y los alimentos con alto contenido de grasa, International aid/development worker y sal. Maricela Curet al menos 30 minutos de ejercicio 5 o ms das de la semana. Intente dormir de 7 a 8 horas todas las noches. Instrucciones generales  Practique tcnicas de control del estrs como se lo haya indicado el mdico. Beba suficiente lquido como para Pharmacologist la orina de color amarillo plido. Use los medicamentos de venta libre y los recetados solamente como se lo haya indicado el mdico. Concurra a todas las visitas de seguimiento. Esto es importante. Comunquese con un mdico si: Sus sntomas empeoran. Aparecen nuevos sntomas. Se siente abrumado por los problemas y ya no puede manejarlos solo. Solicite ayuda de inmediato si: Piensa acerca de lastimarse a usted mismo o a Economist. Busque ayuda de inmediatosi alguna vez siente que puede hacerse dao a usted mismo o a otros, o tiene pensamientos de Patent examiner a su vida. Dirjase al centro de urgencias ms cercano o: Llame al 911. Llame a National Suicide Prevention Lifeline (Lnea Telefnica Nacional para la Prevencin del Suicidio) al [phone] o al 988. Est disponible las 24 horas del da. Enve un mensaje de texto a la lnea para casos de crisis al [phone]. Resumen El estrs es una reaccin normal a los sucesos de la vida. Puede causar problemas si es muy frecuente o dura [place]. La mejor forma de tratar el estrs es Education administrator las tcnicas de control del estrs. La orientacin o la psicoterapia con un profesional de salud mental pueden ser  de ayuda si tiene dificultades para controlar el estrs usted solo. Esta informacin no tiene Building services engineer  consejo del mdico. Asegrese de hacerle al mdico cualquier pregunta que tenga. Document Revised: 02/04/2021 Document Reviewed: 02/04/2021 Elsevier Patient Education  2023 Elsevier Inc.    Edwina Barth, MD [facility]

## 2022-09-27 NOTE — Assessment & Plan Note (Signed)
Active and affecting quality of life. Continues taking Pepcid 40 mg daily Scheduled to see GI doctor next month Diet and nutrition discussed Differential diagnosis discussed Clinical stable.  No red flag signs or symptoms.

## 2022-09-27 NOTE — Assessment & Plan Note (Signed)
Stress management discussed. Contributing to most of her symptoms.

## 2022-10-12 ENCOUNTER — Other Ambulatory Visit: Payer: Self-pay | Admitting: Internal Medicine

## 2022-10-14 ENCOUNTER — Other Ambulatory Visit: Payer: Commercial Managed Care - PPO

## 2022-10-14 DIAGNOSIS — R079 Chest pain, unspecified: Secondary | ICD-10-CM | POA: Diagnosis not present

## 2022-10-14 DIAGNOSIS — R002 Palpitations: Secondary | ICD-10-CM | POA: Diagnosis not present

## 2022-10-26 ENCOUNTER — Other Ambulatory Visit (INDEPENDENT_AMBULATORY_CARE_PROVIDER_SITE_OTHER): Payer: Commercial Managed Care - PPO

## 2022-10-26 ENCOUNTER — Encounter: Payer: Self-pay | Admitting: Physician Assistant

## 2022-10-26 ENCOUNTER — Ambulatory Visit: Payer: Commercial Managed Care - PPO | Admitting: Physician Assistant

## 2022-10-26 ENCOUNTER — Ambulatory Visit: Payer: Commercial Managed Care - PPO | Admitting: Gastroenterology

## 2022-10-26 ENCOUNTER — Other Ambulatory Visit: Payer: Self-pay

## 2022-10-26 VITALS — BP 106/70 | HR 72 | Ht 65.0 in | Wt 143.5 lb

## 2022-10-26 DIAGNOSIS — R1010 Upper abdominal pain, unspecified: Secondary | ICD-10-CM | POA: Diagnosis not present

## 2022-10-26 DIAGNOSIS — R1012 Left upper quadrant pain: Secondary | ICD-10-CM

## 2022-10-26 DIAGNOSIS — R194 Change in bowel habit: Secondary | ICD-10-CM | POA: Diagnosis not present

## 2022-10-26 DIAGNOSIS — R142 Eructation: Secondary | ICD-10-CM

## 2022-10-26 LAB — COMPREHENSIVE METABOLIC PANEL
ALT: 25 U/L (ref 0–35)
AST: 19 U/L (ref 0–37)
Albumin: 4.6 g/dL (ref 3.5–5.2)
Alkaline Phosphatase: 52 U/L (ref 39–117)
BUN: 11 mg/dL (ref 6–23)
CO2: 25 mEq/L (ref 19–32)
Calcium: 9.3 mg/dL (ref 8.4–10.5)
Chloride: 103 mEq/L (ref 96–112)
Creatinine, Ser: 0.66 mg/dL (ref 0.40–1.20)
GFR: 110.85 mL/min (ref >60.00)
Glucose, Bld: 85 mg/dL (ref 70–99)
Potassium: 4 mEq/L (ref 3.5–5.1)
Sodium: 136 mEq/L (ref 135–145)
Total Bilirubin: 0.3 mg/dL (ref 0.2–1.2)
Total Protein: 7.8 g/dL (ref 6.0–8.3)

## 2022-10-26 LAB — CBC WITH DIFFERENTIAL/PLATELET
Basophils Absolute: 0 10*3/uL (ref 0.0–0.1)
Basophils Relative: 0.5 % (ref 0.0–3.0)
Eosinophils Absolute: 0 10*3/uL (ref 0.0–0.7)
Eosinophils Relative: 0.5 % (ref 0.0–5.0)
HCT: 41.6 % (ref 36.0–46.0)
Hemoglobin: 13.8 g/dL (ref 12.0–15.0)
Lymphocytes Relative: 27.8 % (ref 12.0–46.0)
Lymphs Abs: 1.9 10*3/uL (ref 0.7–4.0)
MCHC: 33.1 g/dL (ref 30.0–36.0)
MCV: 88.6 fl (ref 78.0–100.0)
Monocytes Absolute: 0.5 10*3/uL (ref 0.1–1.0)
Monocytes Relative: 6.7 % (ref 3.0–12.0)
Neutro Abs: 4.5 10*3/uL (ref 1.4–7.7)
Neutrophils Relative %: 64.5 % (ref 43.0–77.0)
Platelets: 314 10*3/uL (ref 150.0–400.0)
RBC: 4.7 Mil/uL (ref 3.87–5.11)
RDW: 14 % (ref 11.5–15.5)
WBC: 7 10*3/uL (ref 4.0–10.5)

## 2022-10-26 LAB — LIPASE: Lipase: 17 U/L (ref 11.0–59.0)

## 2022-10-26 LAB — SEDIMENTATION RATE: Sed Rate: 13 mm/hr (ref 0–20)

## 2022-10-26 MED ORDER — PANTOPRAZOLE SODIUM 40 MG PO TBEC: 40.0000 mg | DELAYED_RELEASE_TABLET | Freq: Every morning | ORAL | 3 refills | Status: DC

## 2022-10-26 NOTE — Patient Instructions (Signed)
_______________________________________________________  If your blood pressure at your visit was 140/90 or greater, please contact your primary care physician to follow up on this.  If you are age 39 or younger, your body mass index should be between 19-25. Your Body mass index is 23.88 kg/m. If this is out of the aformentioned range listed, please consider follow up with your Primary Care Provider.  ________________________________________________________  The [facility] providers would like to encourage you to use [facility] to communicate with providers for non-urgent requests or questions.  Due to long hold times on the telephone, sending your provider a message by [facility] may be a faster and more efficient way to get a response.  Please allow 48 business hours for a response.  Please remember that this is for non-urgent requests.  _______________________________________________________ Your provider has requested that you go to the basement level for lab work before leaving today. Press "B" on the elevator. The lab is located at the first door on the left as you exit the elevator.  We have sent the following medications to your pharmacy for you to pick up at your convenience:  START: Protonix 40mg  one tablet  daily before breakfast each morning.  You have been scheduled for a CT scan of the abdomen and pelvis at [facility], 1st floor Radiology. You are scheduled on 11-17-22 at 3:30pm. You should arrive at 1:30pm for registration and to drink contrast.    You may take any medications as prescribed with a small amount of water, if necessary. If you take any of the following medications: METFORMIN, GLUCOPHAGE, GLUCOVANCE, AVANDAMET, RIOMET, FORTAMET, ACTOPLUS MET, JANUMET, GLUMETZA or METAGLIP, you MAY be asked to HOLD this medication 48 hours AFTER the exam.   The purpose of you drinking the oral contrast is to aid in the visualization of your intestinal tract. The contrast solution may  cause some diarrhea. Depending on your individual set of symptoms, you may also receive an intravenous injection of x-ray contrast/dye. Plan on being at [facility] for 45 minutes or longer, depending on the type of exam you are having performed.   If you have any questions regarding your exam or if you need to reschedule, you may call [facility] at [phone] between the hours of 8:00 am and 5:00 pm, Monday-Friday.   Due to recent changes in healthcare laws, you may see the results of your imaging and laboratory studies on MyChart before your provider has had a chance to review them.  We understand that in some cases there may be results that are confusing or concerning to you. Not all laboratory results come back in the same time frame and the provider may be waiting for multiple results in order to interpret others.  Please give [place] 48 hours in order for your provider to thoroughly review all the results before contacting the office for clarification of your results.   Thank you for entrusting me with your care and choosing [facility].  Amy Esterwood, PA-C

## 2022-10-26 NOTE — Progress Notes (Signed)
Subjective:    Patient ID: Amy Gaines, female    DOB: 07/11/1985, 37 y.o.   MRN: [id number]  HPI  Amy Gaines is a pleasant 37 year old female, previously established with Dr. Orvan Falconer and last seen in February 2023.  She had undergone EGD in November 2022 due to complaints of left upper quadrant pain and some dysphagia.  Exam was normal.  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 pylori positive. Patient completed a course of therapy for H. pylori and did do H. pylori stool antigen in February 2023 that was negative.  She comes back in today stating that she has been having a different sort of abdominal pain which she describes as a pressure-like sensation more in her upper abdomen on the left side and mid upper abdomen.  She is also been having a lot of gas and bloating all of the symptoms present over the past 4 months.  She said initially symptoms were intermittent but now are occurring on a daily basis usually worse at the end of the day.  He has not had any vomiting, has had occasional nausea and occasional mild heartburn.  She has tried putting herself on a lactose-free diet with no change in symptoms, stop drinking coffee with no change in symptoms.  Bowel movements alternate, some days has urgency and diarrhea, some days normal bowel movements, and at other times may go a couple days without a bowel movement. She had been given a prescription for famotidine 40 mg daily which she is taken over the past month and has not made much difference in symptoms.  She also mentions a fullness in her throat that seems to be relieved by belching or burping.  No real dysphagia or odynophagia. No regular EtOH, no regular aspirin or NSAIDs.  Review of Systems Pertinent positive and negative review of systems were noted in the above HPI section.  All other  review of systems was otherwise negative.   Outpatient Encounter Medications as of 10/26/2022  Medication Sig   albuterol (VENTOLIN HFA) 108 (90 Base) MCG/ACT inhaler Inhale 2 puffs into the lungs every 6 (six) hours as needed for wheezing or shortness of breath.   famotidine (PEPCID) 40 MG tablet Take 1 tablet by mouth once daily   pantoprazole (PROTONIX) 40 MG tablet Take 1 tablet (40 mg total) by mouth in the morning. Take before breakfast meal each day.   zolpidem (AMBIEN) 5 MG tablet Take 1 tablet (5 mg total) by mouth at bedtime as needed for sleep. (Patient not taking: Reported on 10/26/2022)   No facility-administered encounter medications on file as of 10/26/2022.   Allergies  Allergen Reactions   Tetracyclines & Related Rash    Facial swelling, redness and itching   Gadolinium Derivatives Hives, Nausea And Vomiting and Rash    Pt had N/V followed with hives/rash   Patient Active Problem List   Diagnosis Date Noted   Stress 09/27/2022   Insomnia 09/27/2022   Epigastric pain 09/27/2022   Anemia 09/16/2022   Asthma 09/16/2022   Hepatitis A 09/16/2022   History of abnormal cervical Papanicolaou smear 10/26/2021   Cervical high risk HPV (human papillomavirus) test positive 10/26/2021   Abnormal Pap smear of cervix 10/26/2021   Gastroesophageal reflux disease without esophagitis 10/03/2020   Intermittent asthma 10/03/2020   Social History   Socioeconomic History   Marital status:  Married    Spouse name: Not on file   Number of children: 2   Years of education: Not on file   Highest education level: High school graduate  Occupational History   Not on file  Tobacco Use   Smoking status: Never   Smokeless tobacco: Never  Vaping Use   Vaping Use: Never used  Substance and Sexual Activity   Alcohol use: No    Comment: occasional   Drug use: No   Sexual activity: Yes    Birth control/protection: Condom  Other Topics Concern   Not on file  Social History Narrative    Lives at home with her husband and 2 children   Right handed   Caffeine: "16 oz a day maybe"   Social Determinants of Corporate investment banker Strain: Not on file  Food Insecurity: Not on file  Transportation Needs: Not on file  Physical Activity: Not on file  Stress: Not on file  Social Connections: Not on file  Intimate Partner Violence: Not on file    Amy Gaines's family history includes Cancer in her maternal grandmother; Diabetes in her maternal grandmother and mother; Heart disease in her maternal grandmother and mother; Hypertension in her mother; Migraines in her cousin.      Objective:    Vitals:   10/26/22 0849  BP: 106/70  Pulse: 72    Physical Exam Well-developed well-nourished female  in no acute distress.  Height, [id number],147  BMI 23.8  HEENT; nontraumatic normocephalic, EOMI, PE R LA, sclera anicteric. Oropharynx; not examined today  Neck; supple, no JVD Cardiovascular; regular rate and rhythm with S1-S2, no murmur rub or gallop Pulmonary; Clear bilaterally Abdomen; soft, there is tenderness in the epigastrium and left upper quadrant, no costal margin tenderness, nondistended, no palpable mass or hepatosplenomegaly, bowel sounds are active Rectal; not done today Skin; benign exam, no jaundice rash or appreciable lesions Extremities; no clubbing cyanosis or edema skin warm and dry Neuro/Psych; alert and oriented x4, grossly nonfocal mood and affect appropriate        Assessment & Plan:    # 8 37 year old female with 55-month history of upper abdominal pressure type pain which has become more constant and worse later in the day.  Alternating bowel habits, increased bloating and gassiness. She had previously been diagnosed with H. pylori gastritis but says that this feels different, and that the pain is worse than what she experienced with the gastritis.  She is definitely tender on exam in the left upper quadrant and epigastrium hypogastrium.  Etiology  of symptoms is not clear.  #2 asthma  Plan; CBC with differential, c-Met, lipase, beta-hCG, sed rate Will start empiric trial of Protonix 40 mg p.o. every morning AC breakfast x 1 month./Refill x 1. Schedule for CT of the abdomen and pelvis with contrast. Patient will  now be established with Dr. Maretta Los PA-C 10/26/2022   Cc: Georgina Quint, *

## 2022-10-27 LAB — HCG, QUANTITATIVE, PREGNANCY: Quantitative HCG: <0.6 m[IU]/mL

## 2022-11-02 NOTE — Progress Notes (Signed)
____________________________________________________________  Attending physician addendum:  Thank you for sending this case to me. I have reviewed the entire note and agree with the plan.   Kyria Bumgardner Danis, MD  ____________________________________________________________  

## 2022-11-16 ENCOUNTER — Other Ambulatory Visit: Payer: Commercial Managed Care - PPO

## 2022-11-17 ENCOUNTER — Ambulatory Visit (HOSPITAL_COMMUNITY): Payer: Commercial Managed Care - PPO

## 2022-11-18 ENCOUNTER — Other Ambulatory Visit: Payer: Commercial Managed Care - PPO

## 2022-11-29 ENCOUNTER — Ambulatory Visit (HOSPITAL_COMMUNITY)
Admission: RE | Admit: 2022-11-29 | Discharge: 2022-11-29 | Disposition: A | Discharge status: Home / Self Care | Payer: Commercial Managed Care - PPO | Source: Ambulatory Visit | Attending: Physician Assistant | Admitting: Physician Assistant

## 2022-11-29 DIAGNOSIS — R1012 Left upper quadrant pain: Secondary | ICD-10-CM | POA: Diagnosis not present

## 2022-11-29 DIAGNOSIS — R142 Eructation: Secondary | ICD-10-CM | POA: Insufficient documentation

## 2022-11-29 DIAGNOSIS — R194 Change in bowel habit: Secondary | ICD-10-CM | POA: Diagnosis not present

## 2022-11-29 DIAGNOSIS — R1010 Upper abdominal pain, unspecified: Secondary | ICD-10-CM | POA: Diagnosis not present

## 2022-11-29 MED ORDER — IOHEXOL 9 MG/ML PO SOLN
1000.0000 mL | ORAL | Status: AC
  Administered 2022-11-29: 1000 mL via ORAL

## 2022-11-29 MED ORDER — IOHEXOL 300 MG/ML  SOLN
100.0000 mL | Freq: Once | INTRAMUSCULAR | Status: AC | PRN
  Administered 2022-11-29: 100 mL via INTRAVENOUS

## 2022-11-29 MED ORDER — IOHEXOL 9 MG/ML PO SOLN
ORAL | Status: AC
  Filled 2022-11-29: qty 1000

## 2022-12-02 ENCOUNTER — Telehealth: Payer: Self-pay | Admitting: Physician Assistant

## 2022-12-02 NOTE — Telephone Encounter (Signed)
See the CT results. Reviewed with the patient. She will get information on her GYN so that a copy of the CT can be faxed to that office.

## 2022-12-02 NOTE — Telephone Encounter (Signed)
Patient called regarding her Ct results would like a nurse to go over it with her.

## 2022-12-03 NOTE — Telephone Encounter (Signed)
Inbound call from patient requesting that we send her CT scan results to her OBGYN Dr. Arby Barrette for her appointment on 7/3.   Fax: [phone]

## 2022-12-03 NOTE — Telephone Encounter (Signed)
CT report faxed to Dr Loreta Ave.

## 2022-12-03 NOTE — Telephone Encounter (Signed)
Inbound call from patient requesting a call back from the nurse. Please advise.

## 2022-12-08 DIAGNOSIS — R5383 Other fatigue: Secondary | ICD-10-CM | POA: Diagnosis not present

## 2022-12-08 DIAGNOSIS — Z1389 Encounter for screening for other disorder: Secondary | ICD-10-CM | POA: Diagnosis not present

## 2022-12-08 DIAGNOSIS — L659 Nonscarring hair loss, unspecified: Secondary | ICD-10-CM | POA: Diagnosis not present

## 2022-12-08 DIAGNOSIS — Z130 Encounter for screening for diseases of the blood and blood-forming organs and certain disorders involving the immune mechanism: Secondary | ICD-10-CM | POA: Diagnosis not present

## 2022-12-08 DIAGNOSIS — Z833 Family history of diabetes mellitus: Secondary | ICD-10-CM | POA: Diagnosis not present

## 2022-12-08 DIAGNOSIS — D259 Leiomyoma of uterus, unspecified: Secondary | ICD-10-CM | POA: Diagnosis not present

## 2022-12-08 DIAGNOSIS — Z01411 Encounter for gynecological examination (general) (routine) with abnormal findings: Secondary | ICD-10-CM | POA: Diagnosis not present

## 2022-12-29 DIAGNOSIS — D259 Leiomyoma of uterus, unspecified: Secondary | ICD-10-CM | POA: Diagnosis not present

## 2022-12-31 ENCOUNTER — Other Ambulatory Visit: Payer: Self-pay | Admitting: Obstetrics and Gynecology

## 2022-12-31 DIAGNOSIS — N644 Mastodynia: Secondary | ICD-10-CM

## 2023-01-12 ENCOUNTER — Encounter: Payer: Self-pay | Admitting: Obstetrics and Gynecology

## 2023-01-12 ENCOUNTER — Other Ambulatory Visit: Payer: Self-pay | Admitting: Obstetrics and Gynecology

## 2023-01-12 ENCOUNTER — Ambulatory Visit
Admission: RE | Admit: 2023-01-12 | Discharge: 2023-01-12 | Disposition: A | Discharge status: Home / Self Care | Payer: Commercial Managed Care - PPO | Source: Ambulatory Visit | Attending: Obstetrics and Gynecology | Admitting: Obstetrics and Gynecology

## 2023-01-12 DIAGNOSIS — N644 Mastodynia: Secondary | ICD-10-CM

## 2023-01-12 DIAGNOSIS — N6489 Other specified disorders of breast: Secondary | ICD-10-CM

## 2023-01-20 ENCOUNTER — Ambulatory Visit: Payer: Commercial Managed Care - PPO

## 2023-01-21 ENCOUNTER — Ambulatory Visit (INDEPENDENT_AMBULATORY_CARE_PROVIDER_SITE_OTHER): Payer: Commercial Managed Care - PPO

## 2023-01-21 DIAGNOSIS — Z111 Encounter for screening for respiratory tuberculosis: Secondary | ICD-10-CM

## 2023-01-21 NOTE — Progress Notes (Signed)
Patient received her Tb skin test in her left arm. She responded well to it.

## 2023-01-24 ENCOUNTER — Ambulatory Visit: Payer: Commercial Managed Care - PPO | Admitting: *Deleted

## 2023-01-24 LAB — TB SKIN TEST
Induration: NEGATIVE mm
TB Skin Test: NEGATIVE

## 2023-12-13 ENCOUNTER — Other Ambulatory Visit: Payer: Self-pay | Admitting: Obstetrics and Gynecology

## 2023-12-13 DIAGNOSIS — N644 Mastodynia: Secondary | ICD-10-CM

## 2023-12-23 ENCOUNTER — Telehealth: Payer: Self-pay

## 2023-12-23 NOTE — Telephone Encounter (Signed)
 Copied from CRM [id number]. Topic: Clinical - Request for Lab/Test Order >> Dec 23, 2023 11:52 AM Amy Gaines wrote: Reason for CRM: Pt is requesting Xray for TB placement that is now itching and raised.  Patient callback is [phone]

## 2023-12-26 ENCOUNTER — Other Ambulatory Visit

## 2023-12-26 ENCOUNTER — Encounter

## 2023-12-26 NOTE — Telephone Encounter (Signed)
 Call back and clarify the question please.  If she requesting a chest x-ray due to a positive PPD test?  Who ordered the test?

## 2024-01-09 ENCOUNTER — Ambulatory Visit

## 2024-01-09 ENCOUNTER — Ambulatory Visit
Admission: RE | Admit: 2024-01-09 | Discharge: 2024-01-09 | Disposition: A | Discharge status: Home / Self Care | Payer: Self-pay | Source: Ambulatory Visit | Attending: Obstetrics and Gynecology | Admitting: Obstetrics and Gynecology

## 2024-01-09 DIAGNOSIS — N644 Mastodynia: Secondary | ICD-10-CM

## 2024-02-01 ENCOUNTER — Telehealth: Payer: Self-pay | Admitting: Cardiology

## 2024-02-01 NOTE — Telephone Encounter (Signed)
 FYI Called pt from Recall with Dr Edwyna but pt said she doesn't feel she needs to come back.

## 2024-02-23 DIAGNOSIS — D251 Intramural leiomyoma of uterus: Secondary | ICD-10-CM

## 2024-03-13 ENCOUNTER — Encounter (HOSPITAL_COMMUNITY): Payer: Self-pay | Admitting: Obstetrics and Gynecology

## 2024-03-15 ENCOUNTER — Encounter (HOSPITAL_COMMUNITY): Payer: Self-pay | Admitting: Obstetrics and Gynecology

## 2024-03-15 NOTE — Progress Notes (Signed)
 Spoke w/ via phone for pre-op interview--- pt Lab needs dos---- upt         Lab results------ lab appt 03-19-2024 @ 1300 getting CBC/ T&S COVID test -----patient states asymptomatic no test needed Arrive at ------- 0530 on 03-22-2024 NPO after MN NO Solid Food.  Clear liquids from MN until--- 0430 Pre-Surgery Ensure or G2:  n/a  Med rec completed Medications to take morning of surgery ----- none Diabetic medication ----- n/a  GLP1 agonist last dose: n/a GLP1 instructions:  Patient instructed no nail polish to be worn day of surgery Patient instructed to bring photo id and insurance card day of surgery Patient aware to have Driver (ride ) / caregiver    for 24 hours after surgery - husband, jesus maisones mercado Patient Special Instructions ----- will pick up soap and written instructions at lab appt Pre-Op special Instructions -----  pt speaks english very well and stated reads english.  Pt stated did not need interpreter.  Patient verbalized understanding of instructions that were given at this phone interview. Patient denies chest pain, sob, fever, cough at the interview.

## 2024-03-15 NOTE — Pre-Procedure Instructions (Signed)
 Surgical Instructions  Your procedure is scheduled on :  Thursday,  03-22-2024 Report to [facility] Main Entrance A at 5:30 AM, then check in the Admitting office. Any questions or running late day of surgery :  call [phone]  Questions prior to your surgery day:  call [phone], Monday -- Friday 8am - 4pm. If you experience any cold or flu symptoms such as cough, fever, chills, shortness of breath, etc. between now and you scheduled surgery, please notify your surgeon office.   Remember: Do not eat any food after midnight the night before surgery. You may have clear liquids from midnight night before surgery until 4:30 AM.   Clear liquids allowed are:  Water             Carbonated Beverages (diabetics choose diet or no sugar options)  Clear Tea ( no milk, no honey, etc.)  Black coffee ( NO MILK, CREAM OR POWDERED CREAMER OF ANY KIND)  Sport drinks, like Gatorade (diabetes choose diet or no sugar options)  NO clear liquid after 4:30 AM day of surgery.  This includes No water,  candy,  gum, and mints.  Take these medicines the morning of surgery with A SIPS OF WATER:  NONE   May take these medicines IF NEEDED:   Albuterol  (ventolin ) inhaler ~~ Please bring your inhaler with you day of surgery   One week prior to surgery, STOP taking any Aspirin (unless otherwise instructed by your surgeon) Aleve, Naproxen, ibuprofen , Motrin , Advil , Goody's, BC's, all herbal medications/ supplements, fish oil, and non-prescription vitamins.  Do NOT Smoke (tobacco/ vaping) and Do Not drink alcohol for 24 hours prior to your procedure.  For those patients that use a CPAP.  Please bring your CPAP/ mask/ tubing with them day of surgery . Anesthesia may ask recovery room nurse to use and if you stay the night you be asked to use it.  You will be asked to removed any contacts, glasses, piercing's, hearing aid's, dentures/ partials prior to surgery.  Please bring cases/ container/ solution/ etc., for  them day of surgery.   Patients discharged the day of surgery will NOT be allowed to drive home.  You must have responsible driver and caregiver to stay at home with you the next 24 hours.  SURGICAL WAITING ROOM VISITATION Patients may have no more than 2 support people in the waiting area - if more than 2 , these visitors may rotate.  Pre-op nurse will coordinate an appropriate time for 1 Adult support person, who may not rotate, to accompany patient in pre-op.  Aware some patients may have certain circumstances, speak to pre-op nurse day of surgery.  Children under the age 90 must have an adult with them who is not the patient and must remain in the main waiting area with an adult.  If the patient needs to stay at the hospital during part of their recovery, the visitor guidelines for inpatient rooms apply.  Please refer to the [facility] website for the visitor guidelines for any additional information.  If you received a COVID test during your pre-op visit it is requested that you wear a mask when out in public, stay away from anyone that may not be feeling well and notify your surgeon if you develop symptoms.  If you have been in contact with anyone that has tested positive in the past 10 days notify your surgeon.     [place] - Preparing for Surgery  Before surgery, you can play an  important role. Because skin is not sterile, it needs to be as free of germs as possible. You can reduce the number of germs on your skin by washing with CHG (chlorhexidine gluconate) soap before surgery. CHG is an antiseptic cleaner which kills germs and bonds with the skin to continue killing germs even after washing. Oral hygiene is also important in reducing the risk of infection. Remember to brush your teeth with your regular toothpaste the morning of surgery.  Please DO NOT use if you have an allergy to CHG or antibacterial soaps. If your skin becomes reddened/irritated stop using the CHG and inform your  Pre-op nurse day of surgery.  DO NOT shave (including legs and genital area) for at least 48 hours prior to your CHG shower.   Please follow these instructions carefully:  Shower with CHG soap the night before surgery. If you choose to wash your hair, wash your hair first as usual with your normal shampoo. After you shampoo, rinse your hair and body thoroughly to remove the shampoo. Use CHG as you would any other liquid soap. You can apply CHG directly to the skin and wash gently with a clean washcloth or shower sponge. Apply the CHG soap to your body ONLY FROM THE NECK DOWN. Do not use on open wounds or open sores. Avoid contact with your eyes, ears, mouth, and genitals (private parts). Wash genitals (private parts) with your normal soap. Wash thoroughly, paying special attention to the area where your surgery will be performed. Thoroughly rinse your body with warm water from the neck down. DO NOT shower/wash with your normal soap after using and rinsing off the CHG soap. DO NOT use lotions, oils, etc., after showering with CHG. Pat yourself dry with a clean towel. Wear clean pajamas. Place clean sheets on your bed the night of your CHG shower and do not sleep with pets.  Day of Surgery  DO NOT Apply any lotions,  powder,  oils,  deodorants (may use underarm deodorant),  cologne/  perfumes  or makeup Do Not wear jewelry /  piercing's/  metal/  permanent jewelry must be removed prior to arrival day of surgery. (No plastic piercing) Do Not wear nail polish,  gel polish,  artificial nails, or any other type of covering on natural finger nails (toe nails are okay) Remember to brush your teeth and rinse mouth out. Put on clean / comfortable clothes. [facility] is not responsible for valuables/ personal belongings

## 2024-03-19 ENCOUNTER — Encounter (HOSPITAL_COMMUNITY)
Admission: RE | Admit: 2024-03-19 | Discharge: 2024-03-19 | Disposition: A | Discharge status: Home / Self Care | Source: Ambulatory Visit | Attending: Obstetrics and Gynecology | Admitting: Obstetrics and Gynecology

## 2024-03-19 DIAGNOSIS — Z01812 Encounter for preprocedural laboratory examination: Secondary | ICD-10-CM | POA: Diagnosis present

## 2024-03-19 DIAGNOSIS — D251 Intramural leiomyoma of uterus: Secondary | ICD-10-CM | POA: Diagnosis not present

## 2024-03-19 LAB — CBC
HCT: 38 % (ref 36.0–46.0)
Hemoglobin: 12.4 g/dL (ref 12.0–15.0)
MCH: 28.9 pg (ref 26.0–34.0)
MCHC: 32.6 g/dL (ref 30.0–36.0)
MCV: 88.6 fL (ref 80.0–100.0)
Platelets: 245 K/uL (ref 150–400)
RBC: 4.29 MIL/uL (ref 3.87–5.11)
RDW: 12.9 % (ref 11.5–15.5)
WBC: 6 K/uL (ref 4.0–10.5)
nRBC: 0 % (ref 0.0–0.2)

## 2024-03-21 NOTE — H&P (Signed)
 Amy Gaines is an 38 y.o. female. She has heavy, painful menses and deep dyspareunia, known 7 cm myoma by CT scan and u/s last year. She is ready for definitive treatment.  Pertinent Gynecological History: Last mammogram: abnormal: bilateral assymetry, noting suspicious on diagnostic imaging Date: 2025 Last pap: normal Date: 2023 OB History: G2, P2002 SVD x 2   Menstrual History: Patient's last menstrual period was 02/23/2024 (exact date).    Past Medical History:  Diagnosis Date   General weakness 02/10/2022   03-15-2024  pt stated only when tired when menses comes on   History of abnormal cervical Pap smear 2016   s/p colpo   History of abnormal cervical Papanicolaou smear 10/26/2021   History of anemia    childhood   History of chest pain 09/2022   cardiology evalution by Dr JONELLE. Revanker (lov in epic 09-24-2022)  atypical CP & occasinal palpitations;  EKG-- NSR ;  event monitor 10-20-2022 unremarkable SR w/ isolated SVEs, stress echo ordered but pt did not do and pt was to follow up;  02-01-2024 telephone note call back  in epic for follow up , pt stated do no think she needed it   History of Helicobacter pylori infection 04/2021   EGD w/ bx 04-13-2021 by dr marla. beavers ,  positive h pyloi w/ gastriis,  treated;   stool antigen 02/ 2023  negative   History of hepatitis A    as a teenager   Intramural leiomyoma of uterus    Mild intermittent asthma without complication    followed by pcp   (03-15-2024  pt stated last flare up > 1 yr)   Secondary dysmenorrhea     Past Surgical History:  Procedure Laterality Date   ESOPHAGOGASTRODUODENOSCOPY (EGD) WITH PROPOFOL  04/13/2021   by dr marla. beavers   SOFT TISSUE MASS EXCISION Left 2007   excision axilla lump  (per pt benign)    Family History  Problem Relation Age of Onset   Diabetes Mother    Heart disease Mother    Hypertension Mother    Cancer Maternal Grandmother        cervical   Diabetes Maternal Grandmother     Heart disease Maternal Grandmother    Migraines Cousin    Colon cancer Neg Hx    Esophageal cancer Neg Hx    Pancreatic cancer Neg Hx    Stomach cancer Neg Hx    Liver disease Neg Hx    Colon polyps Neg Hx    Rectal cancer Neg Hx     Social History:  reports that she has never smoked. She has never used smokeless tobacco. She reports current alcohol use. She reports that she does not use drugs.  Allergies:  Allergies  Allergen Reactions   Tetracyclines & Related Swelling and Rash    Facial swelling, redness and itching   Gadolinium Derivatives Hives, Nausea And Vomiting and Rash    Pt had N/V followed with hives/rash    No medications prior to admission.    Review of Systems  Respiratory: Negative.    Cardiovascular: Negative.     Height 5' 4 (1.626 m), weight 68 kg, last menstrual period 02/23/2024. Physical Exam Constitutional:      Appearance: Normal appearance.  Cardiovascular:     Rate and Rhythm: Normal rate and regular rhythm.     Heart sounds: Normal heart sounds. No murmur heard. Pulmonary:     Effort: Pulmonary effort is normal. No respiratory distress.  Breath sounds: Normal breath sounds. No wheezing.  Abdominal:     General: There is no distension.     Palpations: Abdomen is soft. There is no mass.     Tenderness: There is no abdominal tenderness.  Genitourinary:    General: Normal vulva.     Comments: Uterus slightly enlarged and irregular, c/w myomas No adnexal mass Musculoskeletal:     Cervical back: Normal range of motion and neck supple.  Neurological:     Mental Status: She is alert.     No results found for this or any previous visit (from the past 24 hours).  No results found.  Assessment/Plan: Symptomatic myomatous uterus with heavy, painful menses, and deep dyspareunia. All medical and surgical options have been discussed, she is ready for definitive surgical management. Surgical procedure, risks, alternatives, chances of  relieving her symptoms all discussed, questions answered. Will admit for [facility], possible bilateral salpingectomy, cystoscopy  Amy Gaines 03/21/2024, 5:37 PM

## 2024-03-21 NOTE — Anesthesia Preprocedure Evaluation (Signed)
 Anesthesia Evaluation  Patient identified by MRN, date of birth, ID band Patient awake    Reviewed: Allergy & Precautions, H&P , NPO status , Patient's Chart, lab work & pertinent test results  Airway Mallampati: II  TM Distance: >3 FB Neck ROM: Full    Dental no notable dental hx. (+) Teeth Intact, Dental Advisory Given   Pulmonary neg pulmonary ROS, asthma    Pulmonary exam normal breath sounds clear to auscultation       Cardiovascular negative cardio ROS Normal cardiovascular exam Rhythm:Regular Rate:Normal     Neuro/Psych negative neurological ROS  negative psych ROS   GI/Hepatic negative GI ROS, Neg liver ROS,GERD  Medicated,,(+) Hepatitis -  Endo/Other  negative endocrine ROS    Renal/GU negative Renal ROS  negative genitourinary   Musculoskeletal negative musculoskeletal ROS (+)    Abdominal   Peds negative pediatric ROS (+)  Hematology negative hematology ROS (+) Blood dyscrasia, anemia   Anesthesia Other Findings   Reproductive/Obstetrics negative OB ROS                              Anesthesia Physical Anesthesia Plan  ASA: 2  Anesthesia Plan: General   Post-op Pain Management: Tylenol PO (pre-op)* and Celebrex PO (pre-op)*   Induction: Intravenous  PONV Risk Score and Plan: 3 and Ondansetron, Dexamethasone and Treatment may vary due to age or medical condition  Airway Management Planned: Oral ETT and LMA  Additional Equipment: None  Intra-op Plan:   Post-operative Plan: Extubation in OR  Informed Consent:   Plan Discussed with: CRNA, Surgeon and Anesthesiologist  Anesthesia Plan Comments: (  )         Anesthesia Quick Evaluation

## 2024-03-22 ENCOUNTER — Ambulatory Visit (HOSPITAL_COMMUNITY)

## 2024-03-22 ENCOUNTER — Encounter (HOSPITAL_COMMUNITY): Payer: Self-pay | Admitting: Obstetrics and Gynecology

## 2024-03-22 ENCOUNTER — Ambulatory Visit (HOSPITAL_COMMUNITY): Payer: Self-pay | Admitting: Anesthesiology

## 2024-03-22 ENCOUNTER — Other Ambulatory Visit: Payer: Self-pay

## 2024-03-22 ENCOUNTER — Encounter (HOSPITAL_COMMUNITY)
Admission: RE | Disposition: A | Discharge status: Home / Self Care | Payer: Self-pay | Source: Home / Self Care | Attending: Obstetrics and Gynecology

## 2024-03-22 ENCOUNTER — Ambulatory Visit (HOSPITAL_COMMUNITY): Admitting: Anesthesiology

## 2024-03-22 ENCOUNTER — Ambulatory Visit (HOSPITAL_COMMUNITY)
Admission: RE | Admit: 2024-03-22 | Discharge: 2024-03-23 | Disposition: A | Discharge status: Home / Self Care | Payer: Self-pay | Attending: Obstetrics and Gynecology | Admitting: Obstetrics and Gynecology

## 2024-03-22 DIAGNOSIS — Z01818 Encounter for other preprocedural examination: Secondary | ICD-10-CM

## 2024-03-22 DIAGNOSIS — N99820 Postprocedural hemorrhage and hematoma of a genitourinary system organ or structure following a genitourinary system procedure: Secondary | ICD-10-CM | POA: Diagnosis not present

## 2024-03-22 DIAGNOSIS — K759 Inflammatory liver disease, unspecified: Secondary | ICD-10-CM | POA: Diagnosis not present

## 2024-03-22 DIAGNOSIS — N9412 Deep dyspareunia: Secondary | ICD-10-CM | POA: Insufficient documentation

## 2024-03-22 DIAGNOSIS — N99840 Postprocedural hematoma of a genitourinary system organ or structure following a genitourinary system procedure: Secondary | ICD-10-CM | POA: Diagnosis not present

## 2024-03-22 DIAGNOSIS — D259 Leiomyoma of uterus, unspecified: Secondary | ICD-10-CM | POA: Diagnosis present

## 2024-03-22 DIAGNOSIS — D251 Intramural leiomyoma of uterus: Secondary | ICD-10-CM | POA: Insufficient documentation

## 2024-03-22 DIAGNOSIS — D269 Other benign neoplasm of uterus, unspecified: Secondary | ICD-10-CM | POA: Diagnosis not present

## 2024-03-22 DIAGNOSIS — K219 Gastro-esophageal reflux disease without esophagitis: Secondary | ICD-10-CM | POA: Diagnosis not present

## 2024-03-22 DIAGNOSIS — N920 Excessive and frequent menstruation with regular cycle: Secondary | ICD-10-CM | POA: Diagnosis present

## 2024-03-22 DIAGNOSIS — D649 Anemia, unspecified: Secondary | ICD-10-CM | POA: Diagnosis not present

## 2024-03-22 DIAGNOSIS — Z90710 Acquired absence of both cervix and uterus: Secondary | ICD-10-CM | POA: Diagnosis present

## 2024-03-22 DIAGNOSIS — N72 Inflammatory disease of cervix uteri: Secondary | ICD-10-CM | POA: Insufficient documentation

## 2024-03-22 DIAGNOSIS — J4520 Mild intermittent asthma, uncomplicated: Secondary | ICD-10-CM | POA: Insufficient documentation

## 2024-03-22 HISTORY — DX: Personal history of other infectious and parasitic diseases: Z86.19

## 2024-03-22 HISTORY — DX: Prediabetes: R73.03

## 2024-03-22 HISTORY — DX: Intramural leiomyoma of uterus: D25.1

## 2024-03-22 HISTORY — DX: Personal history of diseases of the blood and blood-forming organs and certain disorders involving the immune mechanism: Z86.2

## 2024-03-22 HISTORY — PX: CYSTOSCOPY: SHX5120

## 2024-03-22 HISTORY — DX: Mild intermittent asthma, uncomplicated: J45.20

## 2024-03-22 HISTORY — DX: Secondary dysmenorrhea: N94.5

## 2024-03-22 HISTORY — PX: LAPAROSCOPY: SHX197

## 2024-03-22 HISTORY — PX: VAGINAL HYSTERECTOMY: SHX2639

## 2024-03-22 LAB — CBC
HCT: 30.2 % — ABNORMAL LOW (ref 36.0–46.0)
Hemoglobin: 10.2 g/dL — ABNORMAL LOW (ref 12.0–15.0)
MCH: 29.7 pg (ref 26.0–34.0)
MCHC: 33.8 g/dL (ref 30.0–36.0)
MCV: 87.8 fL (ref 80.0–100.0)
Platelets: 255 K/uL (ref 150–400)
RBC: 3.44 MIL/uL — ABNORMAL LOW (ref 3.87–5.11)
RDW: 12.9 % (ref 11.5–15.5)
WBC: 14.4 K/uL — ABNORMAL HIGH (ref 4.0–10.5)
nRBC: 0 % (ref 0.0–0.2)

## 2024-03-22 LAB — BASIC METABOLIC PANEL WITH GFR
Anion gap: 11 (ref 5–15)
BUN: 10 mg/dL (ref 6–20)
CO2: 19 mmol/L — ABNORMAL LOW (ref 22–32)
Calcium: 8 mg/dL — ABNORMAL LOW (ref 8.9–10.3)
Chloride: 105 mmol/L (ref 98–111)
Creatinine, Ser: 0.98 mg/dL (ref 0.44–1.00)
GFR, Estimated: >60 mL/min (ref >60)
Glucose, Bld: 192 mg/dL — ABNORMAL HIGH (ref 70–99)
Potassium: 4.2 mmol/L (ref 3.5–5.1)
Sodium: 135 mmol/L (ref 135–145)

## 2024-03-22 LAB — POCT PREGNANCY, URINE: Preg Test, Ur: NEGATIVE

## 2024-03-22 LAB — PREPARE RBC (CROSSMATCH)

## 2024-03-22 LAB — ABO/RH: ABO/RH(D): A POS

## 2024-03-22 SURGERY — HYSTERECTOMY, VAGINAL
Anesthesia: General

## 2024-03-22 SURGERY — LAPAROSCOPY, DIAGNOSTIC
Anesthesia: General

## 2024-03-22 MED ORDER — LIDOCAINE 2% (20 MG/ML) 5 ML SYRINGE
INTRAMUSCULAR | Status: AC
  Filled 2024-03-22: qty 5

## 2024-03-22 MED ORDER — ONDANSETRON HCL 4 MG/2ML IJ SOLN
INTRAMUSCULAR | Status: AC
  Filled 2024-03-22: qty 2

## 2024-03-22 MED ORDER — GABAPENTIN 300 MG PO CAPS
ORAL_CAPSULE | ORAL | Status: AC
Start: 2024-03-22 — End: 2024-03-22
  Filled 2024-03-22: qty 1

## 2024-03-22 MED ORDER — BUPIVACAINE HCL (PF) 0.5 % IJ SOLN
INTRAMUSCULAR | Status: AC
Start: 2024-03-22 — End: 2024-03-22
  Filled 2024-03-22: qty 30

## 2024-03-22 MED ORDER — MIDAZOLAM HCL 2 MG/2ML IJ SOLN
INTRAMUSCULAR | Status: AC
Start: 2024-03-22 — End: 2024-03-22
  Filled 2024-03-22: qty 2

## 2024-03-22 MED ORDER — MIDAZOLAM HCL 2 MG/2ML IJ SOLN
INTRAMUSCULAR | Status: AC
  Filled 2024-03-22: qty 2

## 2024-03-22 MED ORDER — ROCURONIUM BROMIDE 10 MG/ML (PF) SYRINGE
PREFILLED_SYRINGE | INTRAVENOUS | Status: DC | PRN
Start: 2024-03-22 — End: 2024-03-22
  Administered 2024-03-22: 50 mg via INTRAVENOUS
  Administered 2024-03-22: 10 mg via INTRAVENOUS

## 2024-03-22 MED ORDER — ONDANSETRON HCL 4 MG PO TABS: 4.0000 mg | ORAL_TABLET | Freq: Four times a day (QID) | ORAL | Status: DC | PRN

## 2024-03-22 MED ORDER — ALBUTEROL SULFATE (2.5 MG/3ML) 0.083% IN NEBU
2.5000 mg | INHALATION_SOLUTION | Freq: Four times a day (QID) | RESPIRATORY_TRACT | Status: AC | PRN
Start: 2024-03-22 — End: ?

## 2024-03-22 MED ORDER — CEFAZOLIN SODIUM-DEXTROSE 2-4 GM/100ML-% IV SOLN
INTRAVENOUS | Status: AC
  Filled 2024-03-22: qty 100

## 2024-03-22 MED ORDER — FENTANYL CITRATE (PF) 100 MCG/2ML IJ SOLN
25.0000 ug | INTRAMUSCULAR | Status: DC | PRN
  Administered 2024-03-22 (×4): 25 ug via INTRAVENOUS

## 2024-03-22 MED ORDER — FENTANYL CITRATE (PF) 250 MCG/5ML IJ SOLN
INTRAMUSCULAR | Status: DC | PRN
  Administered 2024-03-22 – 2024-03-23 (×4): 50 ug via INTRAVENOUS

## 2024-03-22 MED ORDER — SODIUM CHLORIDE (PF) 0.9 % IJ SOLN
INTRAMUSCULAR | Status: AC
  Filled 2024-03-22: qty 50

## 2024-03-22 MED ORDER — LACTATED RINGERS IV SOLN: INTRAVENOUS | Status: DC

## 2024-03-22 MED ORDER — LACTATED RINGERS IV BOLUS
500.0000 mL | Freq: Once | INTRAVENOUS | Status: AC
  Administered 2024-03-22: 500 mL via INTRAVENOUS

## 2024-03-22 MED ORDER — SODIUM CHLORIDE 0.9 % IR SOLN
Status: DC | PRN
  Administered 2024-03-22: 3000 mL

## 2024-03-22 MED ORDER — ONDANSETRON HCL 4 MG/2ML IJ SOLN: 4.0000 mg | Freq: Four times a day (QID) | INTRAMUSCULAR | Status: DC | PRN

## 2024-03-22 MED ORDER — SODIUM CHLORIDE 0.9% IV SOLUTION: Freq: Once | INTRAVENOUS | Status: DC

## 2024-03-22 MED ORDER — SUGAMMADEX SODIUM 200 MG/2ML IV SOLN
INTRAVENOUS | Status: DC | PRN
Start: 2024-03-22 — End: 2024-03-22
  Administered 2024-03-22: 200 mg via INTRAVENOUS

## 2024-03-22 MED ORDER — MEPERIDINE HCL 25 MG/ML IJ SOLN: 6.2500 mg | INTRAMUSCULAR | Status: DC | PRN

## 2024-03-22 MED ORDER — CHLORHEXIDINE GLUCONATE 0.12 % MT SOLN
15.0000 mL | Freq: Once | OROMUCOSAL | Status: AC
  Administered 2024-03-22: 15 mL via OROMUCOSAL

## 2024-03-22 MED ORDER — CEFAZOLIN SODIUM-DEXTROSE 2-3 GM-%(50ML) IV SOLR
INTRAVENOUS | Status: DC | PRN
  Administered 2024-03-22: 2 g via INTRAVENOUS

## 2024-03-22 MED ORDER — SUGAMMADEX SODIUM 200 MG/2ML IV SOLN
INTRAVENOUS | Status: DC | PRN
  Administered 2024-03-22: 200 mg via INTRAVENOUS

## 2024-03-22 MED ORDER — CEFAZOLIN SODIUM-DEXTROSE 2-4 GM/100ML-% IV SOLN
2.0000 g | INTRAVENOUS | Status: AC
  Administered 2024-03-22: 2 g via INTRAVENOUS

## 2024-03-22 MED ORDER — OXYCODONE HCL 5 MG/5ML PO SOLN: 5.0000 mg | Freq: Once | ORAL | Status: DC | PRN

## 2024-03-22 MED ORDER — BUPIVACAINE HCL (PF) 0.25 % IJ SOLN
INTRAMUSCULAR | Status: AC
  Filled 2024-03-22: qty 30

## 2024-03-22 MED ORDER — IOHEXOL 350 MG/ML SOLN
80.0000 mL | Freq: Once | INTRAVENOUS | Status: AC | PRN
Start: 2024-03-22 — End: 2024-03-22
  Administered 2024-03-22: 80 mL via INTRAVENOUS

## 2024-03-22 MED ORDER — FENTANYL CITRATE (PF) 100 MCG/2ML IJ SOLN
INTRAMUSCULAR | Status: AC
  Filled 2024-03-22: qty 2

## 2024-03-22 MED ORDER — ROCURONIUM BROMIDE 10 MG/ML (PF) SYRINGE
PREFILLED_SYRINGE | INTRAVENOUS | Status: DC | PRN
  Administered 2024-03-22: 40 mg via INTRAVENOUS

## 2024-03-22 MED ORDER — MIDAZOLAM HCL (PF) 2 MG/2ML IJ SOLN: 0.5000 mg | Freq: Once | INTRAMUSCULAR | Status: DC | PRN

## 2024-03-22 MED ORDER — ONDANSETRON HCL 4 MG/2ML IJ SOLN: 4.0000 mg | Freq: Once | INTRAMUSCULAR | Status: DC | PRN

## 2024-03-22 MED ORDER — ACETAMINOPHEN 10 MG/ML IV SOLN
INTRAVENOUS | Status: AC | PRN
Start: 2024-03-22 — End: ?
  Administered 2024-03-22: 1000 mg via INTRAVENOUS

## 2024-03-22 MED ORDER — CELECOXIB 200 MG PO CAPS
200.0000 mg | ORAL_CAPSULE | Freq: Once | ORAL | Status: AC
  Administered 2024-03-22: 200 mg via ORAL

## 2024-03-22 MED ORDER — IBUPROFEN 600 MG PO TABS: 600.0000 mg | ORAL_TABLET | Freq: Four times a day (QID) | ORAL | Status: DC

## 2024-03-22 MED ORDER — MIDAZOLAM HCL (PF) 2 MG/2ML IJ SOLN
INTRAMUSCULAR | Status: DC | PRN
  Administered 2024-03-22: 2 mg via INTRAVENOUS

## 2024-03-22 MED ORDER — OXYCODONE HCL 5 MG PO TABS: 5.0000 mg | ORAL_TABLET | Freq: Once | ORAL | Status: DC | PRN

## 2024-03-22 MED ORDER — LIDOCAINE 2% (20 MG/ML) 5 ML SYRINGE
INTRAMUSCULAR | Status: DC | PRN
  Administered 2024-03-22: 60 mg via INTRAVENOUS

## 2024-03-22 MED ORDER — SUCCINYLCHOLINE CHLORIDE 200 MG/10ML IV SOSY
PREFILLED_SYRINGE | INTRAVENOUS | Status: DC | PRN
  Administered 2024-03-22: 140 mg via INTRAVENOUS

## 2024-03-22 MED ORDER — PROPOFOL 10 MG/ML IV BOLUS
INTRAVENOUS | Status: DC | PRN
  Administered 2024-03-22: 120 mg via INTRAVENOUS

## 2024-03-22 MED ORDER — ORAL CARE MOUTH RINSE
15.0000 mL | Freq: Once | OROMUCOSAL | Status: AC
Start: 2024-03-22 — End: 2024-03-22

## 2024-03-22 MED ORDER — OXYCODONE HCL 5 MG PO TABS
5.0000 mg | ORAL_TABLET | ORAL | Status: DC | PRN
  Administered 2024-03-23: 5 mg via ORAL
  Filled 2024-03-22: qty 1

## 2024-03-22 MED ORDER — PROPOFOL 10 MG/ML IV BOLUS
INTRAVENOUS | Status: DC | PRN
  Administered 2024-03-22: 90 mg via INTRAVENOUS

## 2024-03-22 MED ORDER — ROCURONIUM BROMIDE 10 MG/ML (PF) SYRINGE
PREFILLED_SYRINGE | INTRAVENOUS | Status: AC
  Filled 2024-03-22: qty 10

## 2024-03-22 MED ORDER — ACETAMINOPHEN 500 MG PO TABS
ORAL_TABLET | ORAL | Status: AC
  Filled 2024-03-22: qty 2

## 2024-03-22 MED ORDER — SCOPOLAMINE 1 MG/3DAYS TD PT72
1.0000 | MEDICATED_PATCH | TRANSDERMAL | Status: DC
  Administered 2024-03-22: 1 mg via TRANSDERMAL

## 2024-03-22 MED ORDER — ACETAMINOPHEN 500 MG PO TABS
1000.0000 mg | ORAL_TABLET | Freq: Four times a day (QID) | ORAL | Status: DC
  Administered 2024-03-22 – 2024-03-23 (×2): 1000 mg via ORAL
  Filled 2024-03-22 (×3): qty 2

## 2024-03-22 MED ORDER — 0.9 % SODIUM CHLORIDE (POUR BTL) OPTIME
TOPICAL | Status: DC | PRN
  Administered 2024-03-22: 1000 mL

## 2024-03-22 MED ORDER — PROPOFOL 10 MG/ML IV BOLUS
INTRAVENOUS | Status: AC
  Filled 2024-03-22: qty 20

## 2024-03-22 MED ORDER — FENTANYL CITRATE (PF) 250 MCG/5ML IJ SOLN
INTRAMUSCULAR | Status: AC
  Filled 2024-03-22: qty 5

## 2024-03-22 MED ORDER — ROCURONIUM BROMIDE 10 MG/ML (PF) SYRINGE
PREFILLED_SYRINGE | INTRAVENOUS | Status: AC
Start: 2024-03-22 — End: 2024-03-22
  Filled 2024-03-22: qty 10

## 2024-03-22 MED ORDER — SUCCINYLCHOLINE CHLORIDE 200 MG/10ML IV SOSY
PREFILLED_SYRINGE | INTRAVENOUS | Status: AC
  Filled 2024-03-22: qty 10

## 2024-03-22 MED ORDER — DEXAMETHASONE SOD PHOSPHATE PF 10 MG/ML IJ SOLN
INTRAMUSCULAR | Status: DC | PRN
  Administered 2024-03-22: 5 mg via INTRAVENOUS

## 2024-03-22 MED ORDER — BISACODYL 10 MG RE SUPP: 10.0000 mg | Freq: Every day | RECTAL | Status: DC | PRN

## 2024-03-22 MED ORDER — MENTHOL 3 MG MT LOZG: 1.0000 | LOZENGE | OROMUCOSAL | Status: DC | PRN

## 2024-03-22 MED ORDER — CELECOXIB 200 MG PO CAPS
ORAL_CAPSULE | ORAL | Status: AC
  Filled 2024-03-22: qty 1

## 2024-03-22 MED ORDER — EPHEDRINE SULFATE-NACL 50-0.9 MG/10ML-% IV SOSY
PREFILLED_SYRINGE | INTRAVENOUS | Status: DC | PRN
  Administered 2024-03-22: 10 mg via INTRAVENOUS

## 2024-03-22 MED ORDER — GABAPENTIN 300 MG PO CAPS
300.0000 mg | ORAL_CAPSULE | ORAL | Status: AC
  Administered 2024-03-22: 300 mg via ORAL

## 2024-03-22 MED ORDER — BUPIVACAINE HCL (PF) 0.25 % IJ SOLN
INTRAMUSCULAR | Status: DC | PRN
  Administered 2024-03-22: 9 mL

## 2024-03-22 MED ORDER — SCOPOLAMINE 1 MG/3DAYS TD PT72
MEDICATED_PATCH | TRANSDERMAL | Status: AC
  Filled 2024-03-22: qty 1

## 2024-03-22 MED ORDER — DEXTROSE-SODIUM CHLORIDE 5-0.45 % IV SOLN
INTRAVENOUS | Status: DC
  Filled 2024-03-22: qty 1000

## 2024-03-22 MED ORDER — GABAPENTIN 300 MG PO CAPS
300.0000 mg | ORAL_CAPSULE | Freq: Three times a day (TID) | ORAL | Status: DC
  Administered 2024-03-22: 300 mg via ORAL
  Filled 2024-03-22: qty 1

## 2024-03-22 MED ORDER — ALBUMIN HUMAN 5 % IV SOLN
12.5000 g | INTRAVENOUS | Status: AC
  Administered 2024-03-22: 12.5 g via INTRAVENOUS
  Filled 2024-03-22 (×3): qty 250

## 2024-03-22 MED ORDER — DEXAMETHASONE SOD PHOSPHATE PF 10 MG/ML IJ SOLN
INTRAMUSCULAR | Status: DC | PRN
  Administered 2024-03-22: 10 mg via INTRAVENOUS

## 2024-03-22 MED ORDER — SIMETHICONE 80 MG PO CHEW: 80.0000 mg | CHEWABLE_TABLET | Freq: Four times a day (QID) | ORAL | Status: DC | PRN

## 2024-03-22 MED ORDER — ONDANSETRON HCL 4 MG/2ML IJ SOLN
INTRAMUSCULAR | Status: DC | PRN
  Administered 2024-03-22: 4 mg via INTRAVENOUS

## 2024-03-22 MED ORDER — EPHEDRINE 5 MG/ML INJ
INTRAVENOUS | Status: AC
  Filled 2024-03-22: qty 5

## 2024-03-22 MED ORDER — PHENYLEPHRINE HCL-NACL 20-0.9 MG/250ML-% IV SOLN
INTRAVENOUS | Status: DC | PRN
  Administered 2024-03-22: 40 ug/min via INTRAVENOUS

## 2024-03-22 MED ORDER — ONDANSETRON HCL 4 MG/2ML IJ SOLN
INTRAMUSCULAR | Status: AC
Start: 2024-03-22 — End: 2024-03-22
  Filled 2024-03-22: qty 2

## 2024-03-22 MED ORDER — HYDROMORPHONE HCL 1 MG/ML IJ SOLN: 1.0000 mg | INTRAMUSCULAR | Status: DC | PRN

## 2024-03-22 MED ORDER — PHENYLEPHRINE 80 MCG/ML (10ML) SYRINGE FOR IV PUSH (FOR BLOOD PRESSURE SUPPORT)
PREFILLED_SYRINGE | INTRAVENOUS | Status: AC
  Filled 2024-03-22: qty 10

## 2024-03-22 MED ORDER — ALUM & MAG HYDROXIDE-SIMETH 200-200-20 MG/5ML PO SUSP: 30.0000 mL | ORAL | Status: DC | PRN

## 2024-03-22 MED ORDER — CHLORHEXIDINE GLUCONATE 0.12 % MT SOLN
OROMUCOSAL | Status: AC
  Filled 2024-03-22: qty 15

## 2024-03-22 MED ORDER — VASOPRESSIN 20 UNIT/ML IV SOLN
INTRAVENOUS | Status: AC
  Filled 2024-03-22: qty 1

## 2024-03-22 MED ORDER — VASOPRESSIN 20 UNIT/ML IV SOLN
INTRAVENOUS | Status: DC | PRN
  Administered 2024-03-22: 15 mL via SURGICAL_CAVITY

## 2024-03-22 MED ORDER — LACTATED RINGERS IV SOLN: INTRAVENOUS | Status: DC | PRN

## 2024-03-22 MED ORDER — HYDROMORPHONE HCL 1 MG/ML IJ SOLN: 0.2500 mg | INTRAMUSCULAR | Status: DC | PRN

## 2024-03-22 MED ORDER — KETOROLAC TROMETHAMINE 30 MG/ML IJ SOLN
30.0000 mg | Freq: Four times a day (QID) | INTRAMUSCULAR | Status: DC
  Administered 2024-03-22 – 2024-03-23 (×2): 30 mg via INTRAVENOUS
  Filled 2024-03-22 (×2): qty 1

## 2024-03-22 MED ORDER — ACETAMINOPHEN 500 MG PO TABS
1000.0000 mg | ORAL_TABLET | ORAL | Status: AC
  Administered 2024-03-22: 1000 mg via ORAL

## 2024-03-22 MED ORDER — FENTANYL CITRATE (PF) 250 MCG/5ML IJ SOLN
INTRAMUSCULAR | Status: DC | PRN
  Administered 2024-03-22 (×2): 50 ug via INTRAVENOUS

## 2024-03-22 SURGICAL SUPPLY — 37 items
APPLICATOR CHLORAPREP 10.5 ORG (MISCELLANEOUS) IMPLANT
CATH ROBINSON RED A/P 16FR (CATHETERS) IMPLANT
COVER MAYO STAND STRL (DRAPES) ×1 IMPLANT
DERMABOND ADVANCED .7 DNX12 (GAUZE/BANDAGES/DRESSINGS) ×1 IMPLANT
DRAPE SURG IRRIG POUCH 19X23 (DRAPES) ×1 IMPLANT
DRSG OPSITE POSTOP 3X4 (GAUZE/BANDAGES/DRESSINGS) IMPLANT
DURAPREP 26ML APPLICATOR (WOUND CARE) ×1 IMPLANT
GLOVE BIO SURGEON STRL SZ8 (GLOVE) ×1 IMPLANT
GLOVE BIOGEL PI IND STRL 7.0 (GLOVE) ×2 IMPLANT
GLOVE ORTHO TXT STRL SZ7.5 (GLOVE) ×1 IMPLANT
GOWN STRL REUS W/ TWL LRG LVL3 (GOWN DISPOSABLE) ×2 IMPLANT
GOWN STRL REUS W/TWL 2XL LVL3 (GOWN DISPOSABLE) ×1 IMPLANT
IRRIGATION SUCT STRKRFLW 2 WTP (MISCELLANEOUS) IMPLANT
KIT PINK PAD W/HEAD ARM REST (MISCELLANEOUS) IMPLANT
KIT TURNOVER KIT B (KITS) ×1 IMPLANT
MARKER SKIN DUAL TIP RULER LAB (MISCELLANEOUS) IMPLANT
NDL EPID 17G 5 ECHO TUOHY (NEEDLE) IMPLANT
NDL INSUFFLATION 14GA 120MM (NEEDLE) ×1 IMPLANT
NEEDLE EPID 17G 5 ECHO TUOHY (NEEDLE) IMPLANT
NEEDLE INSUFFLATION 14GA 120MM (NEEDLE) ×1 IMPLANT
PACK LAPAROSCOPY BASIN (CUSTOM PROCEDURE TRAY) ×1 IMPLANT
SET TUBE SMOKE EVAC HIGH FLOW (TUBING) ×1 IMPLANT
SHEARS HARMONIC 36 ACE (MISCELLANEOUS) IMPLANT
SLEEVE Z-THREAD 5X100MM (TROCAR) ×1 IMPLANT
SOLN 0.9% NACL 1000 ML (IV SOLUTION) ×1 IMPLANT
SOLN 0.9% NACL POUR BTL 1000ML (IV SOLUTION) ×1 IMPLANT
SOLUTION ELECTROSURG ANTI STCK (MISCELLANEOUS) IMPLANT
SPIKE FLUID TRANSFER (MISCELLANEOUS) ×1 IMPLANT
SUT VIC AB 4-0 PS2 18 (SUTURE) ×1 IMPLANT
SUT VICRYL 0 UR6 27IN ABS (SUTURE) IMPLANT
SYSTEM BAG RETRIEVAL 10MM (BASKET) IMPLANT
TOWEL GREEN STERILE FF (TOWEL DISPOSABLE) ×2 IMPLANT
TRAY FOLEY W/BAG SLVR 14FR (SET/KITS/TRAYS/PACK) IMPLANT
TROCAR 11X100 Z THREAD (TROCAR) IMPLANT
TROCAR BALLN 12MMX100 BLUNT (TROCAR) IMPLANT
TROCAR Z-THREAD OPTICAL 5X100M (TROCAR) IMPLANT
WARMER LAPAROSCOPE (MISCELLANEOUS) ×1 IMPLANT

## 2024-03-22 SURGICAL SUPPLY — 24 items
BAG COUNTER SPONGE SURGICOUNT (BAG) ×1 IMPLANT
CATH ROBINSON RED A/P 16FR (CATHETERS) ×2 IMPLANT
GLOVE BIO SURGEON STRL SZ8 (GLOVE) ×1 IMPLANT
GLOVE BIOGEL PI IND STRL 6.5 (GLOVE) ×1 IMPLANT
GLOVE ORTHO TXT STRL SZ7.5 (GLOVE) ×1 IMPLANT
GLOVE SURG UNDER POLY LF SZ7 (GLOVE) ×1 IMPLANT
GOWN STRL REUS W/ TWL LRG LVL3 (GOWN DISPOSABLE) ×3 IMPLANT
GOWN STRL REUS W/ TWL XL LVL3 (GOWN DISPOSABLE) ×1 IMPLANT
HIBICLENS CHG 4% 4OZ BTL (MISCELLANEOUS) IMPLANT
KIT TURNOVER KIT B (KITS) ×1 IMPLANT
NDL HYPO 21X1.5 SAFETY (NEEDLE) IMPLANT
NEEDLE HYPO 21X1.5 SAFETY (NEEDLE) ×1 IMPLANT
NS IRRIG 1000ML POUR BTL (IV SOLUTION) IMPLANT
PACK VAGINAL WOMENS (CUSTOM PROCEDURE TRAY) ×1 IMPLANT
PAD OB MATERNITY 11 LF (PERSONAL CARE ITEMS) ×1 IMPLANT
SET CYSTO W/LG BORE CLAMP LF (SET/KITS/TRAYS/PACK) ×1 IMPLANT
SHEARS FOC LG CVD HARMONIC 17C (MISCELLANEOUS) ×1 IMPLANT
SUT CHROMIC 1 TIES 18 (SUTURE) ×1 IMPLANT
SUT CHROMIC 1MO 4 18 CR8 (SUTURE) ×1 IMPLANT
SUT SILK 2 0 SH (SUTURE) ×1 IMPLANT
SUT VIC AB 2-0 CT1 36 (SUTURE) ×1 IMPLANT
SYR 3ML LL SCALE MARK (SYRINGE) IMPLANT
TOWEL GREEN STERILE FF (TOWEL DISPOSABLE) ×2 IMPLANT
UNDERPAD 30X36 HEAVY ABSORB (UNDERPADS AND DIAPERS) ×1 IMPLANT

## 2024-03-22 NOTE — Interval H&P Note (Signed)
 History and Physical Interval Note:  03/22/2024 7:20 AM  Amy Gaines  has presented today for surgery, with the diagnosis of leiomyoma of uterus, excessive menstruation.  The various methods of treatment have been discussed with the patient and family. After consideration of risks, benefits and other options for treatment, the patient has consented to  Procedure(s) with comments: HYSTERECTOMY, VAGINAL (Bilateral) - with possible bilateral salpingectomy CYSTOSCOPY (N/A) as a surgical intervention.  The patient's history has been reviewed, patient examined, no change in status, stable for surgery.  I have reviewed the patient's chart and labs.  Questions were answered to the patient's satisfaction.     Krystal BIRCH Avett Reineck

## 2024-03-22 NOTE — Op Note (Addendum)
 Preoperative diagnosis: Symptomatic myomatous uterus Postop diagnosis: Same Procedure: Total vaginal hysterectomy, bilateral salpingectomy and cystoscopy Surgeon: Krystal Deaner M.D. Assistant: Larraine Sharps, D.O. Anesthesia: Gen. Findings: Patient had an enlarged uterus with myomas,normal tubes and ovaries. Via cystoscopy bladder was normal and both ureters were patent.  Assistance from Dr. Sharps was necessary throughout the case to help expose pedicles and control bleeding. Uterine weight was 567 gms in the OR Estimated blood loss: 200 cc Specimens: Uterus and tubes for routine pathology Complications: None   Procedure in detail: The patient was taken to the operating room and placed in the dorsosupine position. General anesthesia was induced and she was placed in mobile stirrups. Legs were elevated in the stirrups. Perineum and vagina were then prepped and draped in the usual sterile fashion, bladder drained with red Robinson catheter. A Graves speculum was inserted in the vagina and the cervix was grasped with Teressa tenaculum. Dilute Pitressin with Marcaine was then instilled at the cervicovaginal junction which was then incised circumferentially with electrocautery. Sharp dissection was then used to further free the vagina from the cervix. Anterior peritoneum was identified and entered sharply. A Deaver retractor was used to retract the bladder anteriorly. Posterior cul-de-sac was identified and entered sharply. A Bonnano speculum was placed into the posterior cul-de-sac. Uterosacral ligaments were clamped transected and ligated with #1 chromic and tagged for later use. The remaining pedicles were then taken down with harmonic scalpel.  The uterus was fairly wide and bulky, difficult to deliver, so morcellation was carried out. Once a large chunk of uterus was removed, I was able to bring each side down and take down utero-ovarian ligaments and the right tube with harmonic scalpel. After a couple  more pieces were removed the entire uterus delivered and I was able to remove the left tube with harmonic scalpel. Ovaries were inspected and found to be normal. A small amount of bleeding was controlled with the Harmonic scalpel.  The uterosacral ligaments were then plicated in the midline with 2-0 silk after the Bonnano speculum was removed and a shorter vaginal speculum was placed. The previously tagged uterosacral pedicles were resutured and then also tied in the midline. No significant bleeding was identified. The vagina was then closed in a vertical fashion with running locking 2-0 Vicryl with adequate closure and adequate hemostasis.   Attention was turned to cystoscopy.  A 70 cystoscope was inserted and 200 cc of fluid was instilled. The bladder appeared normal. Both ureteral orifices were easily identified and urine was seen to flow freely from each orifice. The cystoscope was removed and the bladder was drained with a red robinson catheter. The patient was taken down from stirrups. She was awakened in the operating room and taken to the recovery room in stable condition after tolerating the procedure well. Counts were correct, she had PAS hose on throughout the procedure, she received Ancef preop.

## 2024-03-22 NOTE — Anesthesia Preprocedure Evaluation (Addendum)
 Anesthesia Evaluation  Patient identified by MRN, date of birth, ID band Patient awake    Reviewed: Allergy & Precautions, NPO status , Patient's Chart, lab work & pertinent test results  History of Anesthesia Complications Negative for: history of anesthetic complications  Airway Mallampati: I  TM Distance: >3 FB Neck ROM: Full    Dental  (+) Dental Advisory Given   Pulmonary asthma , COPD,  COPD inhaler   breath sounds clear to auscultation       Cardiovascular  Rhythm:Regular Rate:Normal  Resuscitation 1u pRBCs, 250 ml Albumin   Neuro/Psych negative neurological ROS     GI/Hepatic Neg liver ROS,GERD  Medicated and Controlled,,  Endo/Other  negative endocrine ROS    Renal/GU negative Renal ROS     Musculoskeletal   Abdominal   Peds  Hematology Hb 10.2, plt 255k   Anesthesia Other Findings Post-op hysterectomy bleed:   CT: A cluster of tissue within the pelvis measuring approximately 9.5 x 13 cm consistent with blood product/hematoma. Linear contrast blush in the right hemipelvis as described in the report (127/10) is concerning for the site of bleed. Interventional radiology consult is advised.    Reproductive/Obstetrics                              Anesthesia Physical Anesthesia Plan  ASA: 2 and emergent  Anesthesia Plan: General   Post-op Pain Management: Ofirmev IV (intra-op)*   Induction: Intravenous and Rapid sequence  PONV Risk Score and Plan: 3 and Ondansetron, Dexamethasone and Treatment may vary due to age or medical condition  Airway Management Planned: Oral ETT  Additional Equipment: None  Intra-op Plan:   Post-operative Plan: Extubation in OR  Informed Consent: I have reviewed the patients History and Physical, chart, labs and discussed the procedure including the risks, benefits and alternatives for the proposed anesthesia with the patient or authorized  representative who has indicated his/her understanding and acceptance.     Dental advisory given  Plan Discussed with: CRNA and Surgeon  Anesthesia Plan Comments:          Anesthesia Quick Evaluation

## 2024-03-22 NOTE — Op Note (Signed)
 Preoperative diagnosis: Postoperative bleeding with intraperitoneal hematoma Postoperative diagnosis: Same with bleeding from the right ovary and right IP ligament Procedure: Laparoscopy with right oophorectomy and control of bleeding Surgeon: Krystal Deaner, MD Anesthesia General Endotracheal tube Findings: She had approximately 1500 cc of clot in the pelvis.  When most of the clot was removed there was bleeding from the right ovary and IP ligament.  I did not feel as good I will control the bleeding without removing the ovary.  The remainder of her suture lines looked hemostatic Estimated blood loss: 1500 cc of clot in the pelvis and approximately 50 cc from the surgery Specimens: Right ovary  Procedure in detail:  The patient was taken to the operating room and placed in the dorsal supine position.  General anesthesia was induced.  She was then placed in mobile stirrups and left arm was tucked to her side.  Abdomen and perineum were prepped and draped in the usual sterile fashion and a Foley catheter was inserted.  A 1 cm vertical incision was made in the inferior umbilicus.  The Veress needle was inserted with an opening pressure of 6 mmHg.  CO2 was insufflated and the Veress needle was removed.  A 5 mm trocar was then introduced without difficulty with direct visualization.  5 mm ports were then also placed on each side under direct visualization.  Inspection revealed the above-mentioned findings.  The pelvis was copiously suctioned and irrigated.  The only apparent bleeding was from around the right ovary and right IP ligament.  I did not feel that I was going to be able to preserve the right ovary and control the bleeding.  The right ovary was removed with harmonic scalpel.  All bleeding areas on the right IP ligament were controlled with harmonic scalpel.  There was 1 dripping vessel which was controlled and I believe that was the source of her bleeding.  Pelvis was again irrigated and otherwise  found to be hemostatic.  The left sided trocar was replaced with a 1011 trocar.  An Endo Catch bag was placed and the right ovary was scooped into the bag and brought to the incision.  I was then able to use a Kocher clamp to spread the fascia and the ovary was able to be removed in the bag.  This left-sided incision was then closed with a figure-of-eight suture of 0 Vicryl.  The pelvis was again inspected with the patient flat and further liquid blood was suctioned out.  The trocar on the right was removed under direct visualization.  All gas was allowed to deflate from the abdomen and the umbilical trocar was removed.  Skin incisions were closed with interrupted subcuticular sutures of 4-0 Monocryl.  She tolerated the procedure well and was taken to the recovery room in stable condition.  Counts were correct and she had PAS hose on throughout the procedure.  Foley catheter was to be removed prior to being taken to recovery.

## 2024-03-22 NOTE — Progress Notes (Signed)
 CTA with min-mod intraperitoneal blood, probable small arterial bleeding right pelvis. I have discussed options with Dr. Luverne, he feels this blood vessel is small enough they might not see it with study, and then blood would still be in pelvis. Discussed with patient, will take back to OR for laparoscopy, evacuation of hematoma, control any visible bleeding. OR notified.

## 2024-03-22 NOTE — Anesthesia Procedure Notes (Signed)
 Procedure Name: Intubation Date/Time: 03/22/2024 9:17 AM  Performed by: Viviana Almarie DASEN, CRNAPre-anesthesia Checklist: Patient identified, Emergency Drugs available, Suction available and Patient being monitored Patient Re-evaluated:Patient Re-evaluated prior to induction Oxygen Delivery Method: Circle System Utilized Preoxygenation: Pre-oxygenation with 100% oxygen Induction Type: IV induction Ventilation: Mask ventilation without difficulty Laryngoscope Size: Mac and 3 Grade View: Grade I Tube type: Oral Tube size: 6.5 mm Number of attempts: 1 Airway Equipment and Method: Stylet Placement Confirmation: ETT inserted through vocal cords under direct vision, positive ETCO2 and breath sounds checked- equal and bilateral Secured at: 21 cm Tube secured with: Tape Dental Injury: Teeth and Oropharynx as per pre-operative assessment

## 2024-03-22 NOTE — Transfer of Care (Signed)
 Immediate Anesthesia Transfer of Care Note  Patient: Amy Gaines  Procedure(s) Performed: HYSTERECTOMY, VAGINAL, BILATERAL SALPINGECTOMY CYSTOSCOPY  Patient Location: PACU  Anesthesia Type:General  Level of Consciousness: alert , oriented, and patient cooperative  Airway & Oxygen Therapy: Patient Spontanous Breathing  Post-op Assessment: Report given to RN, Post -op Vital signs reviewed and stable, Patient moving all extremities X 4, and Patient able to stick tongue midline  Post vital signs: Reviewed and stable  Last Vitals:  Vitals Value Taken Time  BP 129/66 03/22/24 10:45  Temp 98.2   Pulse 87 03/22/24 10:45  Resp 20 03/22/24 10:45  SpO2 95 % 03/22/24 10:45  Vitals shown include unfiled device data.  Last Pain:  Vitals:   03/22/24 0613  TempSrc: Oral  PainSc: 0-No pain      Patients Stated Pain Goal: 6 (03/22/24 9386)  Complications: No notable events documented.

## 2024-03-22 NOTE — Progress Notes (Signed)
 While waiting for transport to CT, she became hypotensive and pale again  Abd- soft, a bit more tender, maybe more distended  Will give another 500 cc IV fluid bolus and 1 unit PRBC now, as well as 12.5 gm albumin in order to stabilize to get to CTA

## 2024-03-22 NOTE — Anesthesia Procedure Notes (Signed)
 Procedure Name: Intubation Date/Time: 03/22/2024 10:35 PM  Performed by: Roddie Grate, CRNAPre-anesthesia Checklist: Patient identified, Emergency Drugs available, Suction available, Patient being monitored and Timeout performed Patient Re-evaluated:Patient Re-evaluated prior to induction Oxygen Delivery Method: Circle system utilized Preoxygenation: Pre-oxygenation with 100% oxygen Induction Type: IV induction, Rapid sequence and Cricoid Pressure applied Laryngoscope Size: Mac and 3 Grade View: Grade I Tube type: Oral Tube size: 6.5 mm Number of attempts: 1 Airway Equipment and Method: Stylet Placement Confirmation: ETT inserted through vocal cords under direct vision, positive ETCO2 and breath sounds checked- equal and bilateral Secured at: 22 cm Tube secured with: Tape Dental Injury: Teeth and Oropharynx as per pre-operative assessment  Comments: Smooth IV Induction. Eyes taped. RSI Performed. DL x 1 with grade 1 view. Atraumatically placed, teeth and lip remain intact as pre-op. Secured with tape. Bilateral breath sounds +/=, EtCO2 +, Adequate TV, VSS.

## 2024-03-22 NOTE — Progress Notes (Signed)
 Post-op check, s/p TVH  Was doing well, ambulating, minimal pain, tol PO, but about an hour ago she became lightheaded, pale, and hypotensive. Has voided, pain still ok  Afeb, BP 85/48, P 65 Abd-soft, mildly tender, minimally distended No heavy vaginal bleeding  Hemoglobin 12.4 pre-op to 10.2  I suspect she is having some bleeding, hopefully just a hematoma that will tamponade. However, discussed with Dr. Lu[place] of IR, will get CTA and monitor closely, getting IV bolus, will T&C 2 units in case

## 2024-03-22 NOTE — Anesthesia Postprocedure Evaluation (Signed)
 Anesthesia Post Note  Patient: Amy Gaines  Procedure(s) Performed: HYSTERECTOMY, VAGINAL, BILATERAL SALPINGECTOMY CYSTOSCOPY     Patient location during evaluation: PACU Anesthesia Type: General Level of consciousness: awake and alert Pain management: pain level controlled Vital Signs Assessment: post-procedure vital signs reviewed and stable Respiratory status: spontaneous breathing, nonlabored ventilation, respiratory function stable and patient connected to nasal cannula oxygen Cardiovascular status: blood pressure returned to baseline and stable Postop Assessment: no apparent nausea or vomiting Anesthetic complications: no   No notable events documented.  Last Vitals:  Vitals:   03/22/24 1311 03/22/24 1331  BP:  (!) 97/55  Pulse:  97  Resp:    Temp: 36.4 C   SpO2: 100% 99%    Last Pain:  Vitals:   03/22/24 1311  TempSrc: Oral  PainSc:    Pain Goal: Patients Stated Pain Goal: 6 (03/22/24 9386)                 [name]

## 2024-03-23 ENCOUNTER — Encounter (HOSPITAL_COMMUNITY): Payer: Self-pay | Admitting: Obstetrics and Gynecology

## 2024-03-23 DIAGNOSIS — D251 Intramural leiomyoma of uterus: Secondary | ICD-10-CM | POA: Diagnosis not present

## 2024-03-23 LAB — POCT I-STAT EG7
Acid-base deficit: 5 mmol/L — ABNORMAL HIGH (ref 0.0–2.0)
Bicarbonate: 20.3 mmol/L (ref 20.0–28.0)
Calcium, Ion: 1.17 mmol/L (ref 1.15–1.40)
HCT: 26 % — ABNORMAL LOW (ref 36.0–46.0)
Hemoglobin: 8.8 g/dL — ABNORMAL LOW (ref 12.0–15.0)
O2 Saturation: 100 %
Patient temperature: 37
Potassium: 4.7 mmol/L (ref 3.5–5.1)
Sodium: 138 mmol/L (ref 135–145)
TCO2: 21 mmol/L — ABNORMAL LOW (ref 22–32)
pCO2, Ven: 36.9 mmHg — ABNORMAL LOW (ref 44–60)
pH, Ven: 7.348 (ref 7.25–7.43)
pO2, Ven: 186 mmHg — ABNORMAL HIGH (ref 32–45)

## 2024-03-23 LAB — CBC WITH DIFFERENTIAL/PLATELET
Abs Immature Granulocytes: 0.05 K/uL (ref 0.00–0.07)
Basophils Absolute: 0 K/uL (ref 0.0–0.1)
Basophils Relative: 0 %
Eosinophils Absolute: 0 K/uL (ref 0.0–0.5)
Eosinophils Relative: 0 %
HCT: 22.7 % — ABNORMAL LOW (ref 36.0–46.0)
Hemoglobin: 7.8 g/dL — ABNORMAL LOW (ref 12.0–15.0)
Immature Granulocytes: 0 %
Lymphocytes Relative: 7 %
Lymphs Abs: 0.9 K/uL (ref 0.7–4.0)
MCH: 29.9 pg (ref 26.0–34.0)
MCHC: 34.4 g/dL (ref 30.0–36.0)
MCV: 87 fL (ref 80.0–100.0)
Monocytes Absolute: 0.4 K/uL (ref 0.1–1.0)
Monocytes Relative: 3 %
Neutro Abs: 12.6 K/uL — ABNORMAL HIGH (ref 1.7–7.7)
Neutrophils Relative %: 90 %
Platelets: 183 K/uL (ref 150–400)
RBC: 2.61 MIL/uL — ABNORMAL LOW (ref 3.87–5.11)
RDW: 13.6 % (ref 11.5–15.5)
WBC: 14 K/uL — ABNORMAL HIGH (ref 4.0–10.5)
nRBC: 0 % (ref 0.0–0.2)

## 2024-03-23 LAB — SURGICAL PATHOLOGY

## 2024-03-23 MED ORDER — OXYCODONE HCL 5 MG PO TABS: 5.0000 mg | ORAL_TABLET | ORAL | 0 refills | Status: DC | PRN

## 2024-03-23 MED ORDER — GABAPENTIN 300 MG PO CAPS: 300.0000 mg | ORAL_CAPSULE | Freq: Three times a day (TID) | ORAL | 0 refills | Status: DC

## 2024-03-23 MED ORDER — IBUPROFEN 600 MG PO TABS: 600.0000 mg | ORAL_TABLET | Freq: Four times a day (QID) | ORAL | 0 refills | Status: DC

## 2024-03-23 NOTE — OR PostOp (Incomplete)
 PACU TO INPATIENT HANDOFF REPORT  Name/Age/Gender Amy Gaines 38 y.o. female  Code Status    Code Status Orders  (From admission, onward)           Start     Ordered   03/22/24 1224  Full code  Continuous       Question:  By:  Answer:  Consent: discussion documented in EHR   03/22/24 1223           Code Status History     Date Active Date Inactive Code Status Order ID Comments User Context   03/22/2024 1223 03/22/2024 1223 Full Code [id number]  Meisinger, Krystal, MD Inpatient       Home/SNF/Other {Discharge Destination:18313::Home}  Chief Complaint Intramural leiomyoma of uterus [D25.1] Excessive or frequent menstruation [N92.0] Uterine leiomyoma [D25.9] S/P vaginal hysterectomy [Z90.710]  Level of Care/Admitting Diagnosis ED Disposition     None       Medical History Past Medical History:  Diagnosis Date   General weakness 02/10/2022   03-15-2024  pt stated only when tired when menses comes on   History of abnormal cervical Pap smear 2016   s/p colpo   History of abnormal cervical Papanicolaou smear 10/26/2021   History of anemia    childhood   History of chest pain 09/2022   cardiology evalution by Dr JONELLE. Revanker (lov in epic 09-24-2022)  atypical CP & occasinal palpitations;  EKG-- NSR ;  event monitor 10-20-2022 unremarkable SR w/ isolated SVEs, stress echo ordered but pt did not do and pt was to follow up;  02-01-2024 telephone note call back  in epic for follow up , pt stated do no think she needed it   History of Helicobacter pylori infection 04/2021   EGD w/ bx 04-13-2021 by dr marla. beavers ,  positive h pyloi w/ gastriis,  treated;   stool antigen 02/ 2023  negative   History of hepatitis A    as a teenager   Intramural leiomyoma of uterus    Mild intermittent asthma without complication    followed by pcp   (03-15-2024  pt stated last flare up > 1 yr)   Secondary dysmenorrhea     Allergies Allergies  Allergen Reactions    Tetracyclines & Related Swelling and Rash    Facial swelling, redness and itching   Gadolinium Derivatives Hives, Nausea And Vomiting and Rash    Pt had N/V followed with hives/rash    IV Location/Drains/Wounds Patient Lines/Drains/Airways Status     Active Line/Drains/Airways     Name Placement date Placement time Site Days   Peripheral IV 03/22/24 18 G Posterior;Right Hand 03/22/24  0631  Hand  1   Peripheral IV 03/22/24 20 G Anterior;Distal;Left Forearm 03/22/24  1850  Forearm  1   Wound 03/22/24 1026 Surgical Closed Surgical Incision Perineum 03/22/24  1026  Perineum  1   Wound 03/22/24 Surgical Laparoscopic Umbilicus Right;Lateral Lateral;Left 03/22/24  --  --  1   Wound 03/22/24 2305 Surgical Closed Surgical Incision Abdomen 03/22/24  2305  Abdomen  1            Labs/Imaging Results for orders placed or performed during the hospital encounter of 03/22/24 (from the past 48 hours)  Pregnancy, urine POC     Status: None   Collection Time: 03/22/24  5:57 AM  Result Value Ref Range   Preg Test, Ur NEGATIVE NEGATIVE    Comment:        THE SENSITIVITY OF THIS  METHODOLOGY IS >20 mIU/mL.   ABO/Rh     Status: None   Collection Time: 03/22/24  6:35 AM  Result Value Ref Range   ABO/RH(D)      A POS Performed at [facility], [address] [place], [place] [address]   CBC     Status: Abnormal   Collection Time: 03/22/24  4:08 PM  Result Value Ref Range   WBC 14.4 (H) 4.0 - 10.5 K/uL   RBC 3.44 (L) 3.87 - 5.11 MIL/uL   Hemoglobin 10.2 (L) 12.0 - 15.0 g/dL   HCT 69.7 (L) 63.9 - 53.9 %   MCV 87.8 80.0 - 100.0 fL   MCH 29.7 26.0 - 34.0 pg   MCHC 33.8 30.0 - 36.0 g/dL   RDW 87.0 88.4 - 84.4 %   Platelets 255 150 - 400 K/uL   nRBC 0.0 0.0 - 0.2 %    Comment: Performed at [facility], [address], [place], [place] [address]  Prepare RBC (crossmatch)     Status: None   Collection Time: 03/22/24  4:47 PM  Result Value Ref Range   Order Confirmation       ORDER PROCESSED BY BLOOD BANK Performed at [facility], [address], [place], [place] [address]   Basic metabolic panel with GFR     Status: Abnormal   Collection Time: 03/22/24  5:19 PM  Result Value Ref Range   Sodium 135 135 - 145 mmol/L   Potassium 4.2 3.5 - 5.1 mmol/L   Chloride 105 98 - 111 mmol/L   CO2 19 (L) 22 - 32 mmol/L   Glucose, Bld 192 (H) 70 - 99 mg/dL    Comment: Glucose reference range applies only to samples taken after fasting for at least 8 hours.   BUN 10 6 - 20 mg/dL   Creatinine, Ser 9.01 0.44 - 1.00 mg/dL   Calcium 8.0 (L) 8.9 - 10.3 mg/dL   GFR, Estimated >39 >39 mL/min    Comment: (NOTE) Calculated using the CKD-EPI Creatinine Equation (2021)    Anion gap 11 5 - 15    Comment: Performed at [facility], [address], [place], [place] [address]  Prepare RBC (crossmatch)     Status: None   Collection Time: 03/22/24  6:50 PM  Result Value Ref Range   Order Confirmation      ORDER PROCESSED BY BLOOD BANK Performed at [facility], [address], [place], [place] [address]   Prepare RBC (crossmatch)     Status: None   Collection Time: 03/22/24 10:17 PM  Result Value Ref Range   Order Confirmation      ORDER PROCESSED BY BLOOD BANK Performed at [facility], [address] [place], [place] [address]    CT Angio Abd/Pel w/ and/or w/o Addendum Date: 03/22/2024 ADDENDUM REPORT: 03/22/2024 21:04 ADDENDUM: Additional history provided stating the patient is status post hysterectomy. A cluster of tissue within the pelvis measuring approximately 9.5 x 13 cm consistent with blood product/hematoma. Linear contrast blush in the right hemipelvis as described in the report (127/10) is concerning for the site of bleed. Interventional radiology consult is advised. These results were called by telephone at the time of interpretation on 03/22/2024 at 9:02 pm to Dr. HORACIO, who verbally acknowledged these results. Electronically Signed   By:  Vanetta Chou M.D.   On: 03/22/2024 21:04   Result Date: 03/22/2024 CLINICAL DATA:  Concern for  retroperitoneal hematoma. EXAM: CTA ABDOMEN AND PELVIS WITHOUT AND WITH CONTRAST TECHNIQUE: Multidetector CT imaging of the abdomen and pelvis was performed using the standard protocol during bolus administration of intravenous contrast. Multiplanar reconstructed images and MIPs were obtained and reviewed to evaluate the vascular anatomy. RADIATION DOSE REDUCTION: This exam was performed according to the departmental dose-optimization program which includes automated exposure control, adjustment of the mA and/or kV according to patient size and/or use of iterative reconstruction technique. CONTRAST:  80mL OMNIPAQUE  IOHEXOL  350 MG/ML SOLN COMPARISON:  CT abdomen pelvis dated 11/19/2022. FINDINGS: VASCULAR Aorta: Normal caliber aorta without aneurysm, dissection, vasculitis or significant stenosis. Celiac: Patent without evidence of aneurysm, dissection, vasculitis or significant stenosis. SMA: Patent without evidence of aneurysm, dissection, vasculitis or significant stenosis. Renals: Both renal arteries are patent without evidence of aneurysm, dissection, vasculitis, fibromuscular dysplasia or significant stenosis. IMA: Patent without evidence of aneurysm, dissection, vasculitis or significant stenosis. Inflow: Patent without evidence of aneurysm, dissection, vasculitis or significant stenosis. Proximal Outflow: The visualized proximal outflow is patent. Veins: The IVC is unremarkable.  No portal venous gas. Review of the MIP images confirms the above findings. NON-VASCULAR Lower chest: Minimal bibasilar dependent atelectasis. The visualized lung bases are otherwise clear. No intra-abdominal free air.  Small ascites. Hepatobiliary: The liver is unremarkable. No biliary ductal dilatation. The gallbladder is unremarkable. Pancreas: Unremarkable. No pancreatic ductal dilatation or surrounding inflammatory changes.  Spleen: Normal in size without focal abnormality. Adrenals/Urinary Tract: The adrenal glands unremarkable. There is no hydronephrosis on either side. The visualized ureters and urinary bladder appear unremarkable. Small amount of air within the urinary likely introduced via a recent catheterization. Stomach/Bowel: There is no bowel obstruction or active inflammation. The appendix is normal. Lymphatic: No adenopathy. Reproductive: Ill-defined and heterogeneous appearance of the uterus with small pockets of air. Tiny pockets of air within the pelvis likely intravascular and related to recent procedure. The appearance of the uterus is likely related to recent uterine artery embolization. Clinical correlation is recommended. Other: There is layering high attenuating content in the right paracolic gutter and right pelvis consistent with blood product. Faint linear contrast in the right lower uterus (127/10 and coronal 90/19) noted which may represent bleeding uterine vessel. Catheter angiography may provide better evaluation. Musculoskeletal: No acute osseous pathology. IMPRESSION: 1. Small amount of blood product in the right paracolic gutter and right lower pelvis. Faint vascular blush in the right lower uterus may represent a bleeding vessel. Catheter angiography may provide better evaluation. 2. Ill-defined and heterogeneous appearance of the uterus likely related to recent uterine artery embolization. These results will be called to the ordering clinician or representative by the Radiologist Assistant, and communication documented in the PACS or [employer]. Electronically Signed: By: Vanetta Chou M.D. On: 03/22/2024 20:35    Pending Labs   Vitals/Pain Today's Vitals   03/22/24 2100 03/23/24 0010 03/23/24 0015 03/23/24 0030  BP: (!) 102/57 105/62 106/63 109/65  Pulse: 70 (!) 108 100 100  Resp:  15 16 20   Temp:  98.1 F (36.7 C)    TempSrc:      SpO2:  98% 97% 98%  Weight:      Height:       PainSc:   Asleep     Isolation Precautions @ISOLATION @  Administered Medications Periop Administered Meds from 03/22/2024 0042 to 03/23/2024 0042       Date/Time Order Dose Route Action Action by Comments    03/22/2024 2222 EDT 0.9 %  sodium chloride  infusion (Manually  program via Guardrails IV Fluids) -- Intravenous MAR Hold Transfer Provider, Automatic --    03/22/2024 2222 EDT 0.9 %  sodium chloride  infusion (Manually program via Guardrails IV Fluids) -- Intravenous MAR Hold Transfer Provider, Automatic --    03/22/2024 2315 EDT 0.9 %  sodium chloride  infusion (Manually program via Guardrails IV Fluids) -- Intravenous Automatically Held Transfer Provider, Automatic --    03/22/2024 2222 EDT 0.9 %  sodium chloride  infusion (Manually program via Guardrails IV Fluids) -- Intravenous MAR Hold Transfer Provider, Automatic --    03/22/2024 0936 EDT 0.9 % irrigation (POUR BTL) 1,000 mL Irrigation Given Meisinger, Krystal, MD Pour    03/22/2024 2208 EDT 0.9 % irrigation (POUR BTL) 1,000 mL Irrigation Given Meisinger, Krystal, MD expires-03/2026    03/22/2024 2335 EDT acetaminophen (OFIRMEV) IV 1,000 mg Intravenous Given Roddie Grate, CRNA --    03/22/2024 0617 EDT acetaminophen (TYLENOL) tablet 1,000 mg 1,000 mg Oral Given Cornetto Moraes, Caitlyn C, RN --    04/07/2024 1800 EDT acetaminophen (TYLENOL) tablet 1,000 mg -- Oral Automatically Held (none) --    04/07/2024 1200 EDT acetaminophen (TYLENOL) tablet 1,000 mg -- Oral Automatically Held (none) --    04/07/2024 0600 EDT acetaminophen (TYLENOL) tablet 1,000 mg -- Oral Automatically Held (none) --    04/07/2024 0000 EDT acetaminophen (TYLENOL) tablet 1,000 mg -- Oral Automatically Held (none) --    04/06/2024 1800 EDT acetaminophen (TYLENOL) tablet 1,000 mg -- Oral Automatically Held (none) --    04/06/2024 1200 EDT acetaminophen (TYLENOL) tablet 1,000 mg -- Oral Automatically Held (none) --    04/06/2024 0600 EDT acetaminophen (TYLENOL)  tablet 1,000 mg -- Oral Automatically Held (none) --    04/06/2024 0000 EDT acetaminophen (TYLENOL) tablet 1,000 mg -- Oral Automatically Held (none) --    04/05/2024 1800 EDT acetaminophen (TYLENOL) tablet 1,000 mg -- Oral Automatically Held (none) --    04/05/2024 1200 EDT acetaminophen (TYLENOL) tablet 1,000 mg -- Oral Automatically Held (none) --    04/05/2024 0600 EDT acetaminophen (TYLENOL) tablet 1,000 mg -- Oral Automatically Held (none) --    04/05/2024 0000 EDT acetaminophen (TYLENOL) tablet 1,000 mg -- Oral Automatically Held (none) --    04/04/2024 1800 EDT acetaminophen (TYLENOL) tablet 1,000 mg -- Oral Automatically Held (none) --    04/04/2024 1200 EDT acetaminophen (TYLENOL) tablet 1,000 mg -- Oral Automatically Held (none) --    04/04/2024 0600 EDT acetaminophen (TYLENOL) tablet 1,000 mg -- Oral Automatically Held (none) --    04/04/2024 0000 EDT acetaminophen (TYLENOL) tablet 1,000 mg -- Oral Automatically Held (none) --    04/03/2024 1800 EDT acetaminophen (TYLENOL) tablet 1,000 mg -- Oral Automatically Held (none) --    04/03/2024 1200 EDT acetaminophen (TYLENOL) tablet 1,000 mg -- Oral Automatically Held (none) --    04/03/2024 0600 EDT acetaminophen (TYLENOL) tablet 1,000 mg -- Oral Automatically Held (none) --    04/03/2024 0000 EDT acetaminophen (TYLENOL) tablet 1,000 mg -- Oral Automatically Held (none) --    04/02/2024 1800 EDT acetaminophen (TYLENOL) tablet 1,000 mg -- Oral Automatically Held (none) --    04/02/2024 1200 EDT acetaminophen (TYLENOL) tablet 1,000 mg -- Oral Automatically Held (none) --    04/02/2024 0600 EDT acetaminophen (TYLENOL) tablet 1,000 mg -- Oral Automatically Held (none) --    04/02/2024 0000 EDT acetaminophen (TYLENOL) tablet 1,000 mg -- Oral Automatically Held (none) --    04/01/2024 1800 EDT acetaminophen (TYLENOL) tablet 1,000 mg -- Oral Automatically Held (none) --    04/01/2024 1200 EDT acetaminophen (  TYLENOL) tablet 1,000 mg -- Oral  Automatically Held (none) --    04/01/2024 0600 EDT acetaminophen (TYLENOL) tablet 1,000 mg -- Oral Automatically Held (none) --    04/01/2024 0000 EDT acetaminophen (TYLENOL) tablet 1,000 mg -- Oral Automatically Held (none) --    03/31/2024 1800 EDT acetaminophen (TYLENOL) tablet 1,000 mg -- Oral Automatically Held (none) --    03/31/2024 1200 EDT acetaminophen (TYLENOL) tablet 1,000 mg -- Oral Automatically Held (none) --    03/31/2024 0600 EDT acetaminophen (TYLENOL) tablet 1,000 mg -- Oral Automatically Held (none) --    03/31/2024 0000 EDT acetaminophen (TYLENOL) tablet 1,000 mg -- Oral Automatically Held (none) --    03/30/2024 1800 EDT acetaminophen (TYLENOL) tablet 1,000 mg -- Oral Automatically Held Transfer Provider, Automatic --    03/30/2024 1200 EDT acetaminophen (TYLENOL) tablet 1,000 mg -- Oral Automatically Held Transfer Provider, Automatic --    03/30/2024 0600 EDT acetaminophen (TYLENOL) tablet 1,000 mg -- Oral Automatically Held Transfer Provider, Automatic --    03/30/2024 0000 EDT acetaminophen (TYLENOL) tablet 1,000 mg -- Oral Automatically Held Transfer Provider, Automatic --    03/29/2024 1800 EDT acetaminophen (TYLENOL) tablet 1,000 mg -- Oral Automatically Held Transfer Provider, Automatic --    03/29/2024 1200 EDT acetaminophen (TYLENOL) tablet 1,000 mg -- Oral Automatically Held Transfer Provider, Automatic --    03/29/2024 0600 EDT acetaminophen (TYLENOL) tablet 1,000 mg -- Oral Automatically Held Transfer Provider, Automatic --    03/29/2024 0000 EDT acetaminophen (TYLENOL) tablet 1,000 mg -- Oral Automatically Held Transfer Provider, Automatic --    03/28/2024 1800 EDT acetaminophen (TYLENOL) tablet 1,000 mg -- Oral Automatically Held Transfer Provider, Automatic --    03/28/2024 1200 EDT acetaminophen (TYLENOL) tablet 1,000 mg -- Oral Automatically Held Transfer Provider, Automatic --    03/28/2024 0600 EDT acetaminophen (TYLENOL) tablet 1,000 mg -- Oral  Automatically Held Transfer Provider, Automatic --    03/28/2024 0000 EDT acetaminophen (TYLENOL) tablet 1,000 mg -- Oral Automatically Held Transfer Provider, Automatic --    03/27/2024 1800 EDT acetaminophen (TYLENOL) tablet 1,000 mg -- Oral Automatically Held Transfer Provider, Automatic --    03/27/2024 1200 EDT acetaminophen (TYLENOL) tablet 1,000 mg -- Oral Automatically Held Transfer Provider, Automatic --    03/27/2024 0600 EDT acetaminophen (TYLENOL) tablet 1,000 mg -- Oral Automatically Held Transfer Provider, Automatic --    03/27/2024 0000 EDT acetaminophen (TYLENOL) tablet 1,000 mg -- Oral Automatically Held Transfer Provider, Automatic --    03/26/2024 1800 EDT acetaminophen (TYLENOL) tablet 1,000 mg -- Oral Automatically Held Transfer Provider, Automatic --    03/26/2024 1200 EDT acetaminophen (TYLENOL) tablet 1,000 mg -- Oral Automatically Held Transfer Provider, Automatic --    03/26/2024 0600 EDT acetaminophen (TYLENOL) tablet 1,000 mg -- Oral Automatically Held Transfer Provider, Automatic --    03/26/2024 0000 EDT acetaminophen (TYLENOL) tablet 1,000 mg -- Oral Automatically Held Transfer Provider, Automatic --    03/25/2024 1800 EDT acetaminophen (TYLENOL) tablet 1,000 mg -- Oral Automatically Held Transfer Provider, Automatic --    03/25/2024 1200 EDT acetaminophen (TYLENOL) tablet 1,000 mg -- Oral Automatically Held Transfer Provider, Automatic --    03/25/2024 0600 EDT acetaminophen (TYLENOL) tablet 1,000 mg -- Oral Automatically Held Transfer Provider, Automatic --    03/25/2024 0000 EDT acetaminophen (TYLENOL) tablet 1,000 mg -- Oral Automatically Held Transfer Provider, Automatic --    03/24/2024 1800 EDT acetaminophen (TYLENOL) tablet 1,000 mg -- Oral Automatically Held Transfer Provider, Automatic --    03/24/2024 1200 EDT acetaminophen (TYLENOL) tablet 1,000 mg --  Oral Automatically Held Location manager, Automatic --    03/24/2024 0600 EDT acetaminophen (TYLENOL)  tablet 1,000 mg -- Oral Automatically Held Transfer Provider, Automatic --    03/24/2024 0000 EDT acetaminophen (TYLENOL) tablet 1,000 mg -- Oral Automatically Held Transfer Provider, Automatic --    03/23/2024 1800 EDT acetaminophen (TYLENOL) tablet 1,000 mg -- Oral Automatically Held Transfer Provider, Automatic --    03/23/2024 1200 EDT acetaminophen (TYLENOL) tablet 1,000 mg -- Oral Automatically Held Transfer Provider, Automatic --    03/23/2024 0600 EDT acetaminophen (TYLENOL) tablet 1,000 mg -- Oral Automatically Held Transfer Provider, Automatic --    03/23/2024 0000 EDT acetaminophen (TYLENOL) tablet 1,000 mg -- Oral Automatically Held Transfer Provider, Automatic --    03/22/2024 2222 EDT acetaminophen (TYLENOL) tablet 1,000 mg -- Oral MAR Hold Transfer Provider, Automatic --    03/22/2024 1305 EDT acetaminophen (TYLENOL) tablet 1,000 mg 1,000 mg Oral Given Arnoldo Llanos, RN --    03/23/2024 0400 EDT albumin human 5 % solution 12.5 g -- Intravenous Automatically Solicitor, Automatic --    03/23/2024 0000 EDT albumin human 5 % solution 12.5 g -- Intravenous Automatically Held Transfer Provider, Automatic --    03/22/2024 2344 EDT albumin human 5 % solution 12.5 g 0  Intravenous Stopped Roddie Grate, CRNA --    03/22/2024 2303 EDT albumin human 5 % solution 12.5 g -- Intravenous New Bag/Given Roddie Grate, CRNA --    03/22/2024 2239 EDT albumin human 5 % solution 12.5 g -- Intravenous New Bag/Given Roddie Grate, CRNA --    03/22/2024 2222 EDT albumin human 5 % solution 12.5 g -- Intravenous MAR Hold Transfer Provider, Automatic --    03/22/2024 2221 EDT albumin human 5 % solution 12.5 g -- Intravenous Restarted Roddie Grate, CRNA --    03/22/2024 2220 EDT albumin human 5 % solution 12.5 g -- Intravenous Neomi Roddie Grate, CRNA Switch to gravity    03/22/2024 2036 EDT albumin human 5 % solution 12.5 g 12.5 g Intravenous New Bag/Given Queenie Randle Glennis ONEIDA, RN --    03/22/2024 2222 EDT albuterol  (PROVENTIL ) (2.5 MG/3ML) 0.083% nebulizer solution 2.5 mg -- Inhalation MAR Hold Transfer Provider, Automatic --    03/22/2024 2222 EDT alum & mag hydroxide-simeth (MAALOX/MYLANTA) 200-200-20 MG/5ML suspension 30 mL -- Oral MAR Hold Transfer Provider, Automatic --    03/22/2024 2222 EDT bisacodyl (DULCOLAX) suppository 10 mg -- Rectal MAR Hold Transfer Provider, Automatic --    03/22/2024 0935 EDT bupivacaine (MARCAINE) 30 mL, vasopressin (PITRESSIN) 20 Units, sodium chloride  (PF) 0.9 % 50 mL 15 mL Operative site/used throughout procedure Given Meisinger, Krystal, MD --    03/22/2024 2302 EDT bupivacaine (PF) (MARCAINE) 0.25 % injection 9 mL Infiltration Given Horacio Krystal, MD expires-09/2027    03/22/2024 9077 EDT ceFAZolin (ANCEF) 2-4 GM/100ML-% IVPB --  Override pull for Anesthesia Viviana Almarie ONEIDA, CRNA Filed by anesthesia medication administration from clinical order [id number]    03/22/2024 2236 EDT ceFAZolin (ANCEF) IVPB 2 g/50 mL premix 2 g Intravenous Given Roddie Grate, CRNA --    03/22/2024 0920 EDT ceFAZolin (ANCEF) IVPB 2g/100 mL premix 2 g Intravenous Given Viviana Almarie ONEIDA, CRNA --    03/22/2024 0617 EDT celecoxib (CELEBREX) capsule 200 mg 200 mg Oral Given Cornetto Moraes, Caitlyn C, RN --    03/22/2024 0618 EDT chlorhexidine (PERIDEX) 0.12 % solution 15 mL 15 mL Mouth/Throat Given Cornetto Moraes, Caitlyn C, RN --    03/22/2024 9077 EDT dexamethasone (DECADRON) injection 10 mg Intravenous Given  Viviana Norris T, CRNA --    03/22/2024 2247 EDT dexamethasone (DECADRON) injection 5 mg Intravenous Given Roddie Grate, CRNA --    03/22/2024 2242 EDT ePHEDrine sulfate (PF) 5mg /mL syringe 10 mg Intravenous Given Roddie Grate, CRNA --    03/22/2024 1128 EDT fentaNYL (SUBLIMAZE) injection 25-50 mcg 25 mcg Intravenous Given Magdaline Roselie BIRCH, RN --    03/22/2024 1114 EDT fentaNYL (SUBLIMAZE) injection 25-50 mcg 25 mcg  Intravenous Given Magdaline Roselie BIRCH, RN --    03/22/2024 1104 EDT fentaNYL (SUBLIMAZE) injection 25-50 mcg 25 mcg Intravenous Given Magdaline Roselie BIRCH, RN --    03/22/2024 1056 EDT fentaNYL (SUBLIMAZE) injection 25-50 mcg 25 mcg Intravenous Given Magdaline Roselie BIRCH, RN --    03/22/2024 9049 EDT fentaNYL citrate (PF) (SUBLIMAZE) injection 50 mcg Intravenous Given Viviana Norris DASEN, CRNA --    03/22/2024 0911 EDT fentaNYL citrate (PF) (SUBLIMAZE) injection 50 mcg Intravenous Given Viviana Norris DASEN, CRNA --    03/23/2024 0010 EDT fentaNYL citrate (PF) (SUBLIMAZE) injection 50 mcg Intravenous Given Roddie Grate, CRNA --    03/23/2024 0003 EDT fentaNYL citrate (PF) (SUBLIMAZE) injection 50 mcg Intravenous Canceled Entry Roddie Grate, CRNA --    03/22/2024 2358 EDT fentaNYL citrate (PF) (SUBLIMAZE) injection 50 mcg Intravenous Given Roddie Grate, CRNA --    03/22/2024 2230 EDT fentaNYL citrate (PF) (SUBLIMAZE) injection 50 mcg Intravenous Given Roddie Grate, CRNA --    03/22/2024 2222 EDT fentaNYL citrate (PF) (SUBLIMAZE) injection 50 mcg Intravenous Given Roddie Grate, CRNA --    03/22/2024 0617 EDT gabapentin (NEURONTIN) capsule 300 mg 300 mg Oral Given Cornetto Moraes, Caitlyn C, RN --    04/07/2024 2200 EDT gabapentin (NEURONTIN) capsule 300 mg -- Oral Automatically Held (none) --    04/07/2024 1600 EDT gabapentin (NEURONTIN) capsule 300 mg -- Oral Automatically Held (none) --    04/07/2024 1000 EDT gabapentin (NEURONTIN) capsule 300 mg -- Oral Automatically Held (none) --    04/06/2024 2200 EDT gabapentin (NEURONTIN) capsule 300 mg -- Oral Automatically Held (none) --    04/06/2024 1600 EDT gabapentin (NEURONTIN) capsule 300 mg -- Oral Automatically Held (none) --    04/06/2024 1000 EDT gabapentin (NEURONTIN) capsule 300 mg -- Oral Automatically Held (none) --    04/05/2024 2200 EDT gabapentin (NEURONTIN) capsule 300 mg -- Oral Automatically Held (none) --     04/05/2024 1600 EDT gabapentin (NEURONTIN) capsule 300 mg -- Oral Automatically Held (none) --    04/05/2024 1000 EDT gabapentin (NEURONTIN) capsule 300 mg -- Oral Automatically Held (none) --    04/04/2024 2200 EDT gabapentin (NEURONTIN) capsule 300 mg -- Oral Automatically Held (none) --    04/04/2024 1600 EDT gabapentin (NEURONTIN) capsule 300 mg -- Oral Automatically Held (none) --    04/04/2024 1000 EDT gabapentin (NEURONTIN) capsule 300 mg -- Oral Automatically Held (none) --    04/03/2024 2200 EDT gabapentin (NEURONTIN) capsule 300 mg -- Oral Automatically Held (none) --    04/03/2024 1600 EDT gabapentin (NEURONTIN) capsule 300 mg -- Oral Automatically Held (none) --    04/03/2024 1000 EDT gabapentin (NEURONTIN) capsule 300 mg -- Oral Automatically Held (none) --    04/02/2024 2200 EDT gabapentin (NEURONTIN) capsule 300 mg -- Oral Automatically Held (none) --    04/02/2024 1600 EDT gabapentin (NEURONTIN) capsule 300 mg -- Oral Automatically Held (none) --    04/02/2024 1000 EDT gabapentin (NEURONTIN) capsule 300 mg -- Oral Automatically Held (none) --    04/01/2024 2200 EDT gabapentin (NEURONTIN) capsule 300 mg -- Oral Automatically  Held (none) --    04/01/2024 1600 EDT gabapentin (NEURONTIN) capsule 300 mg -- Oral Automatically Held (none) --    04/01/2024 1000 EDT gabapentin (NEURONTIN) capsule 300 mg -- Oral Automatically Held (none) --    03/31/2024 2200 EDT gabapentin (NEURONTIN) capsule 300 mg -- Oral Automatically Held (none) --    03/31/2024 1600 EDT gabapentin (NEURONTIN) capsule 300 mg -- Oral Automatically Held (none) --    03/31/2024 1000 EDT gabapentin (NEURONTIN) capsule 300 mg -- Oral Automatically Held (none) --    03/30/2024 2200 EDT gabapentin (NEURONTIN) capsule 300 mg -- Oral Automatically Held Transfer Provider, Automatic --    03/30/2024 1600 EDT gabapentin (NEURONTIN) capsule 300 mg -- Oral Automatically Held Transfer Provider, Automatic --    03/30/2024 1000 EDT  gabapentin (NEURONTIN) capsule 300 mg -- Oral Automatically Held Transfer Provider, Automatic --    03/29/2024 2200 EDT gabapentin (NEURONTIN) capsule 300 mg -- Oral Automatically Held Transfer Provider, Automatic --    03/29/2024 1600 EDT gabapentin (NEURONTIN) capsule 300 mg -- Oral Automatically Held Transfer Provider, Automatic --    03/29/2024 1000 EDT gabapentin (NEURONTIN) capsule 300 mg -- Oral Automatically Held Transfer Provider, Automatic --    03/28/2024 2200 EDT gabapentin (NEURONTIN) capsule 300 mg -- Oral Automatically Held Transfer Provider, Automatic --    03/28/2024 1600 EDT gabapentin (NEURONTIN) capsule 300 mg -- Oral Automatically Held Transfer Provider, Automatic --    03/28/2024 1000 EDT gabapentin (NEURONTIN) capsule 300 mg -- Oral Automatically Held Transfer Provider, Automatic --    03/27/2024 2200 EDT gabapentin (NEURONTIN) capsule 300 mg -- Oral Automatically Held Transfer Provider, Automatic --    03/27/2024 1600 EDT gabapentin (NEURONTIN) capsule 300 mg -- Oral Automatically Held Transfer Provider, Automatic --    03/27/2024 1000 EDT gabapentin (NEURONTIN) capsule 300 mg -- Oral Automatically Held Transfer Provider, Automatic --    03/26/2024 2200 EDT gabapentin (NEURONTIN) capsule 300 mg -- Oral Automatically Held Transfer Provider, Automatic --    03/26/2024 1600 EDT gabapentin (NEURONTIN) capsule 300 mg -- Oral Automatically Held Transfer Provider, Automatic --    03/26/2024 1000 EDT gabapentin (NEURONTIN) capsule 300 mg -- Oral Automatically Held Transfer Provider, Automatic --    03/25/2024 2200 EDT gabapentin (NEURONTIN) capsule 300 mg -- Oral Automatically Held Transfer Provider, Automatic --    03/25/2024 1600 EDT gabapentin (NEURONTIN) capsule 300 mg -- Oral Automatically Held Transfer Provider, Automatic --    03/25/2024 1000 EDT gabapentin (NEURONTIN) capsule 300 mg -- Oral Automatically Held Transfer Provider, Automatic --    03/24/2024 2200 EDT gabapentin  (NEURONTIN) capsule 300 mg -- Oral Automatically Held Transfer Provider, Automatic --    03/24/2024 1600 EDT gabapentin (NEURONTIN) capsule 300 mg -- Oral Automatically Held Transfer Provider, Automatic --    03/24/2024 1000 EDT gabapentin (NEURONTIN) capsule 300 mg -- Oral Automatically Held Transfer Provider, Automatic --    03/23/2024 2200 EDT gabapentin (NEURONTIN) capsule 300 mg -- Oral Automatically Held Transfer Provider, Automatic --    03/23/2024 1600 EDT gabapentin (NEURONTIN) capsule 300 mg -- Oral Automatically Held Transfer Provider, Automatic --    03/23/2024 1000 EDT gabapentin (NEURONTIN) capsule 300 mg -- Oral Automatically Held Transfer Provider, Automatic --    03/22/2024 2222 EDT gabapentin (NEURONTIN) capsule 300 mg -- Oral MAR Hold Transfer Provider, Automatic --    03/22/2024 1531 EDT gabapentin (NEURONTIN) capsule 300 mg 300 mg Oral Given Arnoldo Llanos, RN --    03/22/2024 2222 EDT HYDROmorphone (DILAUDID) injection 1 mg -- Intravenous MAR Hold Transfer  Provider, Automatic --    03/31/2024 1800 EDT ibuprofen  (ADVIL ) tablet 600 mg -- Oral Automatically Held Transfer Provider, Automatic --    03/31/2024 1200 EDT ibuprofen  (ADVIL ) tablet 600 mg -- Oral Automatically Held Transfer Provider, Automatic --    03/31/2024 0600 EDT ibuprofen  (ADVIL ) tablet 600 mg -- Oral Automatically Held Transfer Provider, Automatic --    03/31/2024 0000 EDT ibuprofen  (ADVIL ) tablet 600 mg -- Oral Automatically Held Transfer Provider, Automatic --    03/30/2024 1800 EDT ibuprofen  (ADVIL ) tablet 600 mg -- Oral Automatically Held Transfer Provider, Automatic --    03/30/2024 1200 EDT ibuprofen  (ADVIL ) tablet 600 mg -- Oral Automatically Held Transfer Provider, Automatic --    03/30/2024 0600 EDT ibuprofen  (ADVIL ) tablet 600 mg -- Oral Automatically Held Transfer Provider, Automatic --    03/30/2024 0000 EDT ibuprofen  (ADVIL ) tablet 600 mg -- Oral Automatically Held Transfer Provider, Automatic --     03/29/2024 1800 EDT ibuprofen  (ADVIL ) tablet 600 mg -- Oral Automatically Held Transfer Provider, Automatic --    03/29/2024 1200 EDT ibuprofen  (ADVIL ) tablet 600 mg -- Oral Automatically Held Transfer Provider, Automatic --    03/29/2024 0600 EDT ibuprofen  (ADVIL ) tablet 600 mg -- Oral Automatically Held Transfer Provider, Automatic --    03/29/2024 0000 EDT ibuprofen  (ADVIL ) tablet 600 mg -- Oral Automatically Held Transfer Provider, Automatic --    03/28/2024 1800 EDT ibuprofen  (ADVIL ) tablet 600 mg -- Oral Automatically Held Transfer Provider, Automatic --    03/28/2024 1200 EDT ibuprofen  (ADVIL ) tablet 600 mg -- Oral Automatically Held Transfer Provider, Automatic --    03/28/2024 0600 EDT ibuprofen  (ADVIL ) tablet 600 mg -- Oral Automatically Held Transfer Provider, Automatic --    03/28/2024 0000 EDT ibuprofen  (ADVIL ) tablet 600 mg -- Oral Automatically Held Transfer Provider, Automatic --    03/27/2024 1800 EDT ibuprofen  (ADVIL ) tablet 600 mg -- Oral Automatically Held Transfer Provider, Automatic --    03/27/2024 1200 EDT ibuprofen  (ADVIL ) tablet 600 mg -- Oral Automatically Held Transfer Provider, Automatic --    03/27/2024 0600 EDT ibuprofen  (ADVIL ) tablet 600 mg -- Oral Automatically Held Transfer Provider, Automatic --    03/27/2024 0000 EDT ibuprofen  (ADVIL ) tablet 600 mg -- Oral Automatically Held Transfer Provider, Automatic --    03/26/2024 1800 EDT ibuprofen  (ADVIL ) tablet 600 mg -- Oral Automatically Held Transfer Provider, Automatic --    03/26/2024 1200 EDT ibuprofen  (ADVIL ) tablet 600 mg -- Oral Automatically Held Transfer Provider, Automatic --    03/26/2024 0600 EDT ibuprofen  (ADVIL ) tablet 600 mg -- Oral Automatically Held Transfer Provider, Automatic --    03/26/2024 0000 EDT ibuprofen  (ADVIL ) tablet 600 mg -- Oral Automatically Held Transfer Provider, Automatic --    03/25/2024 1800 EDT ibuprofen  (ADVIL ) tablet 600 mg -- Oral Automatically Held Transfer Provider, Automatic  --    03/25/2024 1200 EDT ibuprofen  (ADVIL ) tablet 600 mg -- Oral Automatically Held Transfer Provider, Automatic --    03/25/2024 0600 EDT ibuprofen  (ADVIL ) tablet 600 mg -- Oral Automatically Held Transfer Provider, Automatic --    03/25/2024 0000 EDT ibuprofen  (ADVIL ) tablet 600 mg -- Oral Automatically Held Transfer Provider, Automatic --    03/24/2024 1800 EDT ibuprofen  (ADVIL ) tablet 600 mg -- Oral Automatically Held Transfer Provider, Automatic --    03/24/2024 1200 EDT ibuprofen  (ADVIL ) tablet 600 mg -- Oral Automatically Held Transfer Provider, Automatic --    03/24/2024 0600 EDT ibuprofen  (ADVIL ) tablet 600 mg -- Oral Automatically Held Transfer Provider, Automatic --    03/24/2024 0000 EDT  ibuprofen  (ADVIL ) tablet 600 mg -- Oral Automatically Held Transfer Provider, Automatic --    03/23/2024 1800 EDT ibuprofen  (ADVIL ) tablet 600 mg -- Oral Automatically Held Transfer Provider, Automatic --    03/23/2024 1200 EDT ibuprofen  (ADVIL ) tablet 600 mg -- Oral Automatically Held Transfer Provider, Automatic --    03/22/2024 2222 EDT ibuprofen  (ADVIL ) tablet 600 mg -- Oral MAR Hold Transfer Provider, Automatic --    03/22/2024 2018 EDT iohexol  (OMNIPAQUE ) 350 MG/ML injection 80 mL 80 mL Intravenous Contrast Given Wonda Harlene RAMAN, RT --    03/23/2024 0600 EDT ketorolac  (TORADOL ) 30 MG/ML injection 30 mg -- Intravenous Automatically Held Location manager, Automatic --    03/23/2024 0000 EDT ketorolac  (TORADOL ) 30 MG/ML injection 30 mg -- Intravenous Automatically Held Transfer Provider, Automatic --    03/22/2024 2222 EDT ketorolac  (TORADOL ) 30 MG/ML injection 30 mg -- Intravenous MAR Hold Transfer Provider, Automatic --    03/22/2024 1306 EDT ketorolac  (TORADOL ) 30 MG/ML injection 30 mg 30 mg Intravenous Given Arnoldo Llanos, RN --    03/22/2024 1558 EDT lactated ringers bolus 500 mL 500 mL Intravenous New Bag/Given Arnoldo Llanos, RN --    03/22/2024 1835 EDT lactated ringers bolus 500 mL 500 mL  Intravenous New Bag/Given Arnoldo Llanos, RN --    03/22/2024 1031 EDT lactated ringers infusion 0  Intravenous Stopped Viviana Almarie DASEN, CRNA --    03/22/2024 0912 EDT lactated ringers infusion -- Intravenous Restarted Viviana Almarie T, CRNA --    03/22/2024 0911 EDT lactated ringers infusion -- Intravenous Neomi Viviana Almarie DASEN, CRNA Switch to gravity    03/22/2024 0911 EDT lactated ringers infusion -- Intravenous Continued from Pre-op Viviana Almarie DASEN, CRNA --    03/22/2024 0636 EDT lactated ringers infusion -- Intravenous New Bag/Given Cornetto Moraes, Caitlyn C, RN --    03/22/2024 1726 EDT lactated ringers infusion -- Intravenous New Bag/Given Arnoldo Llanos, RN --    03/22/2024 1628 EDT lactated ringers infusion -- Intravenous Restarted Arnoldo Llanos, RN --    03/22/2024 1558 EDT lactated ringers infusion 0 mL Intravenous Neomi Arnoldo Llanos, RN --    03/22/2024 1330 EDT lactated ringers infusion -- Intravenous New Bag/Given Arnoldo Llanos, RN --    03/22/2024 2347 EDT lactated ringers infusion -- Intravenous Anesthesia Volume Adjustment Roddie Grate, CRNA --    03/22/2024 2309 EDT lactated ringers infusion -- Intravenous New Bag/Given Roddie Grate, CRNA --    03/22/2024 2202 EDT lactated ringers infusion -- Intravenous New Bag/Given Roddie Grate, CRNA --    03/22/2024 0914 EDT lidocaine 2% (20 mg/mL) 5 mL syringe 60 mg Intravenous Given Viviana Almarie T, CRNA --    03/22/2024 2222 EDT menthol (CEPACOL) lozenge 3 mg -- Oral MAR Hold Transfer Provider, Automatic --    03/22/2024 0911 EDT midazolam PF (VERSED) injection 2 mg Intravenous Given Viviana Almarie DASEN, CRNA --    03/22/2024 2222 EDT midazolam PF (VERSED) injection 2 mg Intravenous Given Roddie Grate, CRNA --    03/22/2024 1025 EDT ondansetron (ZOFRAN) injection 4 mg Intravenous Given Viviana Almarie T, CRNA --    03/22/2024 2348 EDT ondansetron (ZOFRAN) injection 4 mg Intravenous Given Roddie Grate, CRNA --    03/22/2024 2222 EDT ondansetron (ZOFRAN) injection 4 mg -- Intravenous [facility] Hold Transfer Provider, Automatic --    03/22/2024 2222 EDT ondansetron (ZOFRAN) tablet 4 mg -- Oral [facility] Hold Transfer Provider, Automatic --    03/22/2024 9381 EDT Oral care mouth rinse -- Mouth Rinse See Alternative Cornetto Moraes, Caitlyn C,  RN --    03/22/2024 2222 EDT oxyCODONE (Oxy IR/ROXICODONE) immediate release tablet 5-10 mg -- Oral MAR Hold Transfer Provider, Automatic --    03/22/2024 2354 EDT phenylephrine (NEO-SYNEPHRINE) 20mg /NS 250mL premix infusion 0 mcg/min Intravenous Parthenia Roddie Grate, CRNA --    03/22/2024 2351 EDT phenylephrine (NEO-SYNEPHRINE) 20mg /NS 250mL premix infusion 20 mcg/min Intravenous Rate/Dose Change Roddie Grate, CRNA --    03/22/2024 2251 EDT phenylephrine (NEO-SYNEPHRINE) 20mg /NS premix infusion 40 mcg/min Intravenous Rate/Dose Change Roddie Grate, CRNA --    03/22/2024 2242 EDT phenylephrine (NEO-SYNEPHRINE) 20mg /NS premix infusion 60 mcg/min Intravenous Rate/Dose Change Roddie Grate, CRNA --    03/22/2024 2203 EDT phenylephrine (NEO-SYNEPHRINE) 20mg /NS 250mL premix infusion 40 mcg/min Intravenous New Bag/Given Roddie Grate, CRNA --    03/22/2024 0915 EDT propofol (DIPRIVAN) 10 mg/mL bolus/IV push 120 mg Intravenous Given Viviana Almarie DASEN, CRNA --    03/22/2024 2233 EDT propofol (DIPRIVAN) 10 mg/mL bolus/IV push 90 mg Intravenous Given Leonce Athens, MD --    03/22/2024 1004 EDT rocuronium (ZEMURON) injection 10 mg Intravenous Given Viviana Almarie DASEN, CRNA --    03/22/2024 [phone] EDT rocuronium (ZEMURON) injection 50 mg Intravenous Given Viviana Almarie T, CRNA --    03/22/2024 2245 EDT rocuronium (ZEMURON) injection 40 mg Intravenous Given Roddie Grate, CRNA --    03/22/2024 0618 EDT scopolamine (TRANSDERM-SCOP) 1 MG/3DAYS 1 mg 1 mg Transdermal Patch Applied Cornetto Moraes, Caitlyn C, RN --    03/22/2024  2222 EDT simethicone (MYLICON) chewable tablet 80 mg -- Oral MAR Hold Transfer Provider, Automatic --    03/22/2024 2207 EDT sodium chloride  irrigation 0.9 % 3,000 mL Irrigation Given Horacio Boas, MD expires-09/2025    03/22/2024 2233 EDT succinylcholine (ANECTINE) syringe 140 mg Intravenous Given Leonce Athens, MD --    03/22/2024 1030 EDT sugammadex sodium (BRIDION) injection 200 mg Intravenous Given Viviana Almarie DASEN, CRNA --    03/22/2024 2354 EDT sugammadex sodium (BRIDION) injection 200 mg Intravenous Given Roddie Grate, CRNA --       Mobility {Mobility:[id number]}

## 2024-03-23 NOTE — Transfer of Care (Signed)
 Immediate Anesthesia Transfer of Care Note  Patient: Amy Gaines  Procedure(s) Performed: LAPAROSCOPY, DIAGNOSTIC  Patient Location: PACU  Anesthesia Type:General  Level of Consciousness: awake and alert   Airway & Oxygen Therapy: Patient Spontanous Breathing  Post-op Assessment: Report given to RN and Post -op Vital signs reviewed and stable  Post vital signs: Reviewed and stable  Last Vitals:  Vitals Value Taken Time  BP 105/62 03/23/24 00:10  Temp    Pulse 102 03/23/24 00:14  Resp 14 03/23/24 00:12  SpO2 98 % 03/23/24 00:14  Vitals shown include unfiled device data.  Last Pain:  Vitals:   03/22/24 2023  TempSrc:   PainSc: 7       Patients Stated Pain Goal: 6 (03/22/24 9386)  Complications: No notable events documented.

## 2024-03-23 NOTE — Progress Notes (Signed)
 POD #1 [facility], take back laparoscopy for bleeding  Doing well, mild pain, ambulated, voided, tol PO Afeb, VSS Abd-soft, mild tender, incisions intact  Hgb down to 7.8  Will ambulate, d/c home if asymptomatic from acute blood loss anemia. If symptomatic will transfuse one more unit PRBC prior to discharge

## 2024-03-23 NOTE — Discharge Instructions (Signed)
 Routine instructions for vaginal hysterectomy and laparoscopy

## 2024-03-23 NOTE — Anesthesia Postprocedure Evaluation (Signed)
 Anesthesia Post Note  Patient: Amy Gaines  Procedure(s) Performed: LAPAROSCOPY, DIAGNOSTIC     Patient location during evaluation: PACU Anesthesia Type: General Level of consciousness: awake and alert, oriented and patient cooperative Pain management: pain level controlled Vital Signs Assessment: post-procedure vital signs reviewed and stable Respiratory status: spontaneous breathing, nonlabored ventilation and respiratory function stable Cardiovascular status: blood pressure returned to baseline and stable Postop Assessment: no apparent nausea or vomiting Anesthetic complications: no   No notable events documented.  Last Vitals:  Vitals:   03/23/24 0015 03/23/24 0030  BP: 106/63 109/65  Pulse: 100 100  Resp: 16 20  Temp:    SpO2: 97% 98%    Last Pain:  Vitals:   03/23/24 0015  TempSrc:   PainSc: Asleep                 Alexismarie Flaim,E. Ameliarose Shark

## 2024-03-26 LAB — TYPE AND SCREEN
ABO/RH(D): A POS
Antibody Screen: NEGATIVE
Unit division: 0
Unit division: 0
Unit division: 0
Unit division: 0
Unit division: 0

## 2024-03-26 LAB — BPAM RBC
Blood Product Expiration Date: 202510232359
Blood Product Expiration Date: 202511112359
Blood Product Unit Number: 202511112359
ISSUE DATE / TIME: 202510161842
ISSUE DATE / TIME: 202510170332
ISSUE DATE / TIME: 202510170509
ISSUE DATE / TIME: 202510172359
ISSUE DATE / TIME: 202510200413
ISSUE DATE / TIME: 202511122359
Unit Type and Rh: 202510172359
Unit Type and Rh: 202510232359
Unit Type and Rh: 202511112359
Unit Type and Rh: 202511122359
Unit Type and Rh: 6200
Unit Type and Rh: 6200
Unit Type and Rh: 6200
Unit Type and Rh: 9500
Unit Type and Rh: 9500

## 2024-03-26 LAB — SURGICAL PATHOLOGY

## 2024-06-22 ENCOUNTER — Telehealth: Payer: Self-pay

## 2024-06-22 NOTE — Telephone Encounter (Signed)
 Copied from CRM [id number]. 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

## 2024-06-22 NOTE — Telephone Encounter (Signed)
 Copied from CRM [id number]. Topic: Medical Record Request - Records Request >> Jun 22, 2024 10:51 AM Deleta RAMAN wrote: Reason for CRM: patient is requesting shot record for school. Please follow up at [phone]

## 2024-06-25 NOTE — Telephone Encounter (Signed)
 Yes I believe we can order this? Please advise?

## 2024-06-25 NOTE — Telephone Encounter (Signed)
 I am not sure we do drug screening testing for school

## 2024-06-25 NOTE — Telephone Encounter (Signed)
 OK I have got conformation that we do not it will have to be done at urgent care

## 2024-07-04 ENCOUNTER — Encounter: Admitting: Emergency Medicine

## 2024-07-04 NOTE — Assessment & Plan Note (Signed)
 Operated on last October

## 2024-07-12 ENCOUNTER — Encounter: Payer: Self-pay | Admitting: Emergency Medicine

## 2024-07-12 ENCOUNTER — Ambulatory Visit: Payer: Self-pay | Admitting: Emergency Medicine

## 2024-07-12 ENCOUNTER — Ambulatory Visit: Admitting: Emergency Medicine

## 2024-07-12 VITALS — BP 122/80 | HR 71 | Temp 98.0°F | Ht 64.0 in | Wt 161.0 lb

## 2024-07-12 DIAGNOSIS — Z23 Encounter for immunization: Secondary | ICD-10-CM

## 2024-07-12 DIAGNOSIS — Z0184 Encounter for antibody response examination: Secondary | ICD-10-CM

## 2024-07-12 DIAGNOSIS — R5382 Chronic fatigue, unspecified: Secondary | ICD-10-CM

## 2024-07-12 DIAGNOSIS — Z13220 Encounter for screening for lipoid disorders: Secondary | ICD-10-CM

## 2024-07-12 DIAGNOSIS — D259 Leiomyoma of uterus, unspecified: Secondary | ICD-10-CM

## 2024-07-12 DIAGNOSIS — Z0001 Encounter for general adult medical examination with abnormal findings: Secondary | ICD-10-CM

## 2024-07-12 DIAGNOSIS — J4520 Mild intermittent asthma, uncomplicated: Secondary | ICD-10-CM

## 2024-07-12 DIAGNOSIS — G8929 Other chronic pain: Secondary | ICD-10-CM | POA: Insufficient documentation

## 2024-07-12 DIAGNOSIS — Z130 Encounter for screening for diseases of the blood and blood-forming organs and certain disorders involving the immune mechanism: Secondary | ICD-10-CM

## 2024-07-12 LAB — COMPREHENSIVE METABOLIC PANEL WITH GFR
ALT: 17 U/L (ref 3–35)
AST: 17 U/L (ref 5–37)
Albumin: 4.9 g/dL (ref 3.5–5.2)
Alkaline Phosphatase: 78 U/L (ref 39–117)
BUN: 9 mg/dL (ref 6–23)
CO2: 29 meq/L (ref 19–32)
Calcium: 9.7 mg/dL (ref 8.4–10.5)
Chloride: 103 meq/L (ref 96–112)
Creatinine, Ser: 0.67 mg/dL (ref 0.40–1.20)
GFR: 109.13 mL/min
Glucose, Bld: 95 mg/dL (ref 70–99)
Potassium: 4.4 meq/L (ref 3.5–5.1)
Sodium: 137 meq/L (ref 135–145)
Total Bilirubin: 0.3 mg/dL (ref 0.2–1.2)
Total Protein: 8 g/dL (ref 6.0–8.3)

## 2024-07-12 LAB — CBC
HCT: 42.1 % (ref 36.0–46.0)
Hemoglobin: 13.9 g/dL (ref 12.0–15.0)
MCHC: 33 g/dL (ref 30.0–36.0)
MCV: 82.5 fl (ref 78.0–100.0)
Platelets: 304 10*3/uL (ref 150.0–400.0)
RBC: 5.1 Mil/uL (ref 3.87–5.11)
RDW: 15.3 % (ref 11.5–15.5)
WBC: 5 10*3/uL (ref 4.0–10.5)

## 2024-07-12 LAB — HEMOGLOBIN A1C: Hgb A1c MFr Bld: 6.2 % (ref 4.6–6.5)

## 2024-07-12 LAB — LIPID PANEL
Cholesterol: 227 mg/dL — ABNORMAL HIGH (ref 28–200)
HDL: 58.4 mg/dL
LDL Cholesterol: 140 mg/dL — ABNORMAL HIGH (ref 10–99)
NonHDL: 168.23
Total CHOL/HDL Ratio: 4
Triglycerides: 140 mg/dL (ref 10.0–149.0)
VLDL: 28 mg/dL (ref 0.0–40.0)

## 2024-07-12 LAB — VITAMIN D 25 HYDROXY (VIT D DEFICIENCY, FRACTURES): VITD: 100.17 ng/mL — ABNORMAL HIGH (ref 30.00–100.00)

## 2024-07-12 LAB — VITAMIN B12: Vitamin B-12: 686 pg/mL (ref 211–911)

## 2024-07-12 LAB — TSH: TSH: 1.94 u[IU]/mL (ref 0.35–5.50)

## 2024-07-12 NOTE — Assessment & Plan Note (Signed)
 Chronic and affecting quality of life Has not had a workup yet Recommend orthopedic evaluation Referral placed today

## 2024-07-12 NOTE — Assessment & Plan Note (Signed)
 Status post hysterectomy last October Was recently started on hormone replacement therapy

## 2024-07-12 NOTE — Progress Notes (Signed)
 Amy Gaines 38 y.o.   Chief Complaint  Patient presents with   Annual Exam    Pt is needing flu,varicella, and Tb, and other vaccines for school and TD/TDAP    HISTORY OF PRESENT ILLNESS: This is a 39 y.o. female A1A here for annual exam. Needs to be updated with vaccines Status post hysterectomy last October.  Was started on hormone replacement therapy by her gynecologist Also complaining of chronic fatigue and chronic low back pain No other complaints or medical concerns today.  HPI   Prior to Admission medications  Medication Sig Start Date End Date Taking? Authorizing Provider  albuterol  (VENTOLIN  HFA) 108 (90 Base) MCG/ACT inhaler Inhale 2 puffs into the lungs every 6 (six) hours as needed for wheezing or shortness of breath. 02/10/22  Yes Cleta Heatley Jose, MD  Cholecalciferol (VITAMIN D3) 1.25 MG (50000 UT) CAPS Take 1 capsule by mouth once a week. Thursday's   Yes [provider]    Allergies[1]  Patient Active Problem List   Diagnosis Date Noted   Chronic bilateral low back pain without sciatica 07/12/2024   Uterine leiomyoma 03/22/2024   S/P vaginal hysterectomy 03/22/2024   Insomnia 09/27/2022   Anemia 09/16/2022   Asthma 09/16/2022   Chronic fatigue 02/10/2022   History of abnormal cervical Papanicolaou smear 10/26/2021   Cervical high risk HPV (human papillomavirus) test positive 10/26/2021   Gastroesophageal reflux disease without esophagitis 10/03/2020   Intermittent asthma 10/03/2020    Past Medical History:  Diagnosis Date   General weakness 02/10/2022   03-15-2024  pt stated only when tired when menses comes on   History of abnormal cervical Pap smear 2016   s/p colpo   History of abnormal cervical Papanicolaou smear 10/26/2021   History of anemia    childhood   History of chest pain 09/2022   cardiology evalution by Dr JONELLE. Revanker (lov in epic 09-24-2022)  atypical CP & occasinal palpitations;  EKG-- NSR ;  event monitor  10-20-2022 unremarkable SR w/ isolated SVEs, stress echo ordered but pt did not do and pt was to follow up;  02-01-2024 telephone note call back  in epic for follow up , pt stated do no think she needed it   History of Helicobacter pylori infection 04/2021   EGD w/ bx 04-13-2021 by dr marla. beavers ,  positive h pyloi w/ gastriis,  treated;   stool antigen 02/ 2023  negative   History of hepatitis A    as a teenager   Intramural leiomyoma of uterus    Mild intermittent asthma without complication    followed by pcp   (03-15-2024  pt stated last flare up > 1 yr)   Secondary dysmenorrhea     Past Surgical History:  Procedure Laterality Date   CYSTOSCOPY N/A 03/22/2024   Procedure: CYSTOSCOPY;  Surgeon: Horacio Boas, MD;  Location: [facility] OR;  Service: Gynecology;  Laterality: N/A;   ESOPHAGOGASTRODUODENOSCOPY (EGD) WITH PROPOFOL   04/13/2021   by dr marla. beavers   LAPAROSCOPY N/A 03/22/2024   Procedure: LAPAROSCOPY, DIAGNOSTIC;  Surgeon: Horacio Boas, MD;  Location: [facility] OR;  Service: Gynecology;  Laterality: N/A;   SOFT TISSUE MASS EXCISION Left 2007   excision axilla lump  (per pt benign)   VAGINAL HYSTERECTOMY N/A 03/22/2024   Procedure: HYSTERECTOMY, VAGINAL, BILATERAL SALPINGECTOMY;  Surgeon: Horacio Boas, MD;  Location: [facility] OR;  Service: Gynecology;  Laterality: N/A;    Social History   Socioeconomic History   Marital status: Married  Spouse name: Not on file   Number of children: 2   Years of education: Not on file   Highest education level: High school graduate  Occupational History   Not on file  Tobacco Use   Smoking status: Never   Smokeless tobacco: Never  Vaping Use   Vaping status: Never Used  Substance and Sexual Activity   Alcohol use: Yes    Comment: occasional   Drug use: Never   Sexual activity: Yes    Birth control/protection: None  Other Topics Concern   Not on file  Social History Narrative   Lives at home with her husband and 2 children   Right  handed   Caffeine: 16 oz a day maybe   Per pt speaks and reads english   Social Drivers of Health   Tobacco Use: Low Risk (07/12/2024)   Patient History    Smoking Tobacco Use: Never    Smokeless Tobacco Use: Never    Passive Exposure: Not on file  Financial Resource Strain: Not on file  Food Insecurity: Not on file  Transportation Needs: Not on file  Physical Activity: Insufficiently Active (07/08/2024)   Exercise Vital Sign    Days of Exercise per Week: 3 days    Minutes of Exercise per Session: 30 min  Stress: Not on file  Social Connections: Unknown (07/08/2024)   Social Connection and Isolation Panel    Frequency of Communication with Friends and Family: Not on file    Frequency of Social Gatherings with Friends and Family: Not on file    Attends Religious Services: Not on file    Active Member of Clubs or Organizations: No    Attends Banker Meetings: Not on file    Marital Status: Not on file  Intimate Partner Violence: Not on file  Depression (PHQ2-9): Low Risk (07/12/2024)   Depression (PHQ2-9)    PHQ-2 Score: 0  Alcohol Screen: Not on file  Housing: Not on file  Utilities: Not on file  Health Literacy: Not on file    Family History  Problem Relation Age of Onset   Diabetes Mother    Heart disease Mother    Hypertension Mother    Cancer Maternal Grandmother        cervical   Diabetes Maternal Grandmother    Heart disease Maternal Grandmother    Migraines Cousin    Colon cancer Neg Hx    Esophageal cancer Neg Hx    Pancreatic cancer Neg Hx    Stomach cancer Neg Hx    Liver disease Neg Hx    Colon polyps Neg Hx    Rectal cancer Neg Hx      Review of Systems  Constitutional: Negative.  Negative for chills and fever.  HENT: Negative.  Negative for congestion and sore throat.   Respiratory: Negative.  Negative for cough and shortness of breath.   Cardiovascular: Negative.  Negative for chest pain and palpitations.  Gastrointestinal:  Negative  for abdominal pain, diarrhea, nausea and vomiting.  Genitourinary: Negative.  Negative for dysuria and hematuria.  Skin: Negative.  Negative for rash.  Neurological: Negative.  Negative for dizziness and headaches.  All other systems reviewed and are negative.   Vitals:   07/12/24 0911  BP: 122/80  Pulse: 71  Temp: 98 F (36.7 C)  SpO2: 98%    Physical Exam Vitals reviewed.  Constitutional:      Appearance: Normal appearance.  HENT:     Head: Normocephalic.  Right Ear: Tympanic membrane, ear canal and external ear normal.     Left Ear: Tympanic membrane, ear canal and external ear normal.     Mouth/Throat:     Mouth: Mucous membranes are moist.     Pharynx: Oropharynx is clear.  Eyes:     Extraocular Movements: Extraocular movements intact.     Conjunctiva/sclera: Conjunctivae normal.     Pupils: Pupils are equal, round, and reactive to light.  Cardiovascular:     Rate and Rhythm: Normal rate and regular rhythm.     Pulses: Normal pulses.     Heart sounds: Normal heart sounds.  Pulmonary:     Effort: Pulmonary effort is normal.     Breath sounds: Normal breath sounds.  Abdominal:     Palpations: Abdomen is soft.     Tenderness: There is no abdominal tenderness.  Musculoskeletal:     Cervical back: No tenderness.  Lymphadenopathy:     Cervical: No cervical adenopathy.  Skin:    General: Skin is warm and dry.  Neurological:     General: No focal deficit present.     Mental Status: She is alert and oriented to person, place, and time.  Psychiatric:        Mood and Affect: Mood normal.        Behavior: Behavior normal.      ASSESSMENT & PLAN: Problem List Items Addressed This Visit       Respiratory   Intermittent asthma   Clinically stable.  No recent flareups Uses albuterol  inhaler as needed        Genitourinary   Uterine leiomyoma   Status post hysterectomy last October Was recently started on hormone replacement therapy        Other    Chronic fatigue   Associated with multiple other symptoms. Differential diagnosis discussed with patient. Needs work-up. Stress/anxiety contributing factor. Blood work done today. We will follow-up after that.      Chronic bilateral low back pain without sciatica   Chronic and affecting quality of life Has not had a workup yet Recommend orthopedic evaluation Referral placed today      Relevant Orders   Ambulatory referral to Orthopedic Surgery   Other Visit Diagnoses       Encounter for general adult medical examination with abnormal findings    -  Primary   Relevant Orders   TSH   VITAMIN D  25 Hydroxy (Vit-D Deficiency, Fractures)   Vitamin B12   Hemoglobin A1c   CBC   Comprehensive metabolic panel with GFR   Lipid panel     Flu vaccine need       Relevant Orders   Flu vaccine trivalent PF, 6mos and older(Flulaval,Afluria,Fluarix,Fluzone)     Screening for deficiency anemia       Relevant Orders   CBC     Screening for lipoid disorders       Relevant Orders   Lipid panel     Screening for endocrine, metabolic and immunity disorder       Relevant Orders   TSH   VITAMIN D  25 Hydroxy (Vit-D Deficiency, Fractures)   Vitamin B12   Hemoglobin A1c   Comprehensive metabolic panel with GFR     Immunity status testing       Relevant Orders   Measles/Mumps/Rubella Immunity   Varicella zoster antibody, IgG      Modifiable risk factors discussed with patient. Anticipatory guidance according to age provided. The following topics were also discussed: Social  Determinants of Health Smoking.  Non-smoker Diet and nutrition Benefits of exercise Cancer family history review Vaccinations review and recommendations Cardiovascular risk assessment and need for blood work Mental health including depression and anxiety Fall and accident prevention  Patient Instructions  Mantenimiento de radiographer, therapeutic en las mujeres Health Maintenance, Female Adoptar un estilo de vida saludable y  recibir atencin preventiva son importantes para promover la salud y counsellor. Consulte al mdico sobre: El esquema adecuado para hacerse pruebas y exmenes peridicos. Cosas que puede hacer por su cuenta para prevenir enfermedades y [place] sano. Qu debo saber sobre la dieta, el peso y el ejercicio? Consuma una dieta saludable  Consuma una dieta que incluya muchas verduras, frutas, productos lcteos con bajo contenido de grasa y protenas magras. No consuma muchos alimentos ricos en grasas slidas, azcares agregados o sodio. Mantenga un peso saludable El ndice de masa muscular [facility]) se utiliza para identificar problemas de [place]. Proporciona una estimacin de la grasa corporal basndose en el peso y la altura. Su mdico puede ayudarle a determinar su IMC y a personnel officer o pharmacologist un peso saludable. Haga ejercicio con regularidad Haga ejercicio con regularidad. Esta es una de las prcticas ms importantes que puede hacer por su salud. La [employer] de los adultos deben seguir estas pautas: Education Officer, Environmental, al menos, 150 minutos de actividad fsica por semana. El ejercicio debe aumentar la frecuencia cardaca y media planner transpirar (ejercicio de intensidad moderada). Hacer ejercicios de fortalecimiento por lo [facility] por semana. Agregue esto a su plan de ejercicio de intensidad moderada. Pase menos tiempo sentada. Incluso la actividad fsica ligera puede ser beneficiosa. Controle sus niveles de colesterol y lpidos en la sangre Comience a realizarse anlisis de lpidos y oncologist en la sangre a los 20 aos y luego reptalos cada 5 aos. Hgase controlar los niveles de colesterol con mayor frecuencia si: Sus niveles de lpidos y colesterol son altos. Es mayor de 40 aos. Presenta un alto riesgo de padecer enfermedades cardacas. Qu debo saber sobre las pruebas de deteccin del cncer? Segn su historia clnica y sus antecedentes familiares, es posible que deba realizarse pruebas de  deteccin del cncer en diferentes edades. Esto puede incluir pruebas de deteccin de lo siguiente: Cncer de mama. Cncer de cuello uterino. Cncer colorrectal. Cncer de piel. Cncer de pulmn. Qu debo saber sobre la enfermedad cardaca, la diabetes y la hipertensin arterial? Presin arterial y enfermedad cardaca La hipertensin arterial causa enfermedades cardacas y aumenta el riesgo de accidente cerebrovascular. Es ms probable que esto se manifieste en las personas que tienen lecturas de presin arterial alta o tienen sobrepeso. Hgase controlar la presin arterial: Cada 3 a 5 aos si tiene entre 18 y 77 aos. Todos los aos si es mayor de 40 aos. Diabetes Realcese exmenes de deteccin de la diabetes con regularidad. Este anlisis revisa el nivel de azcar en la sangre en [place]. Hgase las pruebas de deteccin: Cada tres aos despus de los 40 aos de edad si tiene un peso normal y un bajo riesgo de padecer diabetes. Con ms frecuencia y a partir de [place] edad inferior si tiene sobrepeso o un alto riesgo de padecer diabetes. Qu debo saber sobre la prevencin de infecciones? Hepatitis B Si tiene un riesgo ms alto de contraer hepatitis B, debe someterse a un examen de deteccin de este virus. Hable con el mdico para averiguar si tiene riesgo de contraer la infeccin por hepatitis B. Hepatitis C Se recomienda el anlisis a: [employer] nacieron  entre 1947 y 39. Todas las personas que tengan un riesgo de haber contrado hepatitis C. Enfermedades de transmisin sexual (ETS) Hgase las pruebas de airline pilot de ITS, incluidas la gonorrea y la clamidia, si: Es sexualmente activa y es menor de [address]. Es mayor de [address], y public affairs consultant informa que corre riesgo de tener este tipo de infecciones. La actividad sexual ha cambiado desde que le hicieron la ltima prueba de deteccin y tiene un riesgo mayor de tener clamidia o copy. Pregntele al mdico si usted tiene  riesgo. Pregntele al mdico si usted tiene un alto riesgo de primary school teacher VIH. El mdico tambin puede recomendarle un medicamento recetado para ayudar a evitar la infeccin por el VIH. Si elige tomar medicamentos para prevenir el VIH, primero debe oneok de deteccin del VIH. Luego debe hacerse anlisis cada 3 meses mientras est tomando los medicamentos. Embarazo Si est por dejar de menstruar (fase premenopusica) y usted puede quedar embarazada, busque asesoramiento antes de quedar embarazada. Tome de 400 a 800 microgramos (mcg) de cido flico todos los das si queda embarazada. Pida mtodos de control de la natalidad (anticonceptivos) si desea evitar un embarazo no deseado. Osteoporosis y menopausia La osteoporosis es una enfermedad en la que los huesos pierden los minerales y la fuerza por el avance de la edad. El resultado pueden ser fracturas en los [place]. Si tiene 65 aos o ms, o si est en riesgo de sufrir osteoporosis y fracturas, pregunte a su mdico si debe: Hacerse pruebas de deteccin de prdida sea. Tomar un suplemento de calcio o de vitamina D para reducir el riesgo de fracturas. Recibir terapia de reemplazo hormonal (TRH) para tratar los sntomas de la menopausia. Siga estas indicaciones en su casa: Consumo de alcohol No beba alcohol si: Su mdico le indica no hacerlo. Est embarazada, puede estar embarazada o est tratando de quedar embarazada. Si bebe alcohol: Limite la cantidad que bebe a lo siguiente: De 0 a 1 bebida por da. Sepa cunta cantidad de alcohol hay en las bebidas que toma. En los [address], una medida equivale a una botella de cerveza de 12 oz (355 ml), un vaso de vino de 5 oz (148 ml) o un vaso de una bebida alcohlica de alta graduacin de 1 oz (44 ml). Estilo de vida No consuma ningn producto que contenga nicotina o tabaco. Estos productos incluyen cigarrillos, tabaco para theatre manager y aparatos de vapeo, como los cigarrillos electrnicos.  Si necesita ayuda para dejar de consumir estos productos, consulte al mdico. No consuma drogas. No comparta agujas. Solicite ayuda a su mdico si necesita apoyo o informacin para abandonar las drogas. Indicaciones generales Realcese los estudios de rutina de [address], dentales y de wellsite geologist. Mantngase al da con las vacunas. Infrmele a su mdico si: Se siente deprimida con frecuencia. Alguna vez ha sido vctima de maltrato o no se siente seguro en su casa. Resumen Adoptar un estilo de vida saludable y recibir atencin preventiva son importantes para promover la salud y counsellor. Siga las instrucciones del mdico acerca de una dieta saludable, el ejercicio y la realizacin de pruebas o exmenes para hotel manager. Siga las instrucciones del mdico con respecto al control del colesterol y la presin arterial. Esta informacin no tiene theme park manager el consejo del mdico. Asegrese de hacerle al mdico cualquier pregunta que tenga. Document Revised: 10/30/2020 Document Reviewed: 10/30/2020 Elsevier Patient Education  2025 Elsevier Inc.      Emil Schaumann, MD [facility]     [1]  Allergies Allergen Reactions   Tetracyclines & Related Swelling and Rash    Facial swelling, redness and itching   Gadolinium Derivatives Hives, Nausea And Vomiting and Rash    Pt had N/V followed with hives/rash

## 2024-07-12 NOTE — Patient Instructions (Signed)
 Mantenimiento de radiographer, therapeutic en las mujeres Health Maintenance, Female Adoptar un estilo de vida saludable y recibir atencin preventiva son importantes para promover la salud y counsellor. Consulte al mdico sobre: El esquema adecuado para hacerse pruebas y exmenes peridicos. Cosas que puede hacer por su cuenta para prevenir enfermedades y [place] sano. Qu debo saber sobre la dieta, el peso y el ejercicio? Consuma una dieta saludable  Consuma una dieta que incluya muchas verduras, frutas, productos lcteos con bajo contenido de grasa y protenas magras. No consuma muchos alimentos ricos en grasas slidas, azcares agregados o sodio. Mantenga un peso saludable El ndice de masa muscular [facility]) se utiliza para identificar problemas de [place]. Proporciona una estimacin de la grasa corporal basndose en el peso y la altura. Su mdico puede ayudarle a determinar su IMC y a personnel officer o pharmacologist un peso saludable. Haga ejercicio con regularidad Haga ejercicio con regularidad. Esta es una de las prcticas ms importantes que puede hacer por su salud. La [employer] de los adultos deben seguir estas pautas: Education Officer, Environmental, al menos, 150 minutos de actividad fsica por semana. El ejercicio debe aumentar la frecuencia cardaca y media planner transpirar (ejercicio de intensidad moderada). Hacer ejercicios de fortalecimiento por lo [facility] aid por semana. Agregue esto a su plan de ejercicio de intensidad moderada. Pase menos tiempo sentada. Incluso la actividad fsica ligera puede ser beneficiosa. Controle sus niveles de colesterol y lpidos en la sangre Comience a realizarse anlisis de lpidos y oncologist en la sangre a los 20 aos y luego reptalos cada 5 aos. Hgase controlar los niveles de colesterol con mayor frecuencia si: Sus niveles de lpidos y colesterol son altos. Es mayor de 40 aos. Presenta un alto riesgo de padecer enfermedades cardacas. Qu debo saber sobre las pruebas de deteccin del  cncer? Segn su historia clnica y sus antecedentes familiares, es posible que deba realizarse pruebas de deteccin del cncer en diferentes edades. Esto puede incluir pruebas de deteccin de lo siguiente: Cncer de mama. Cncer de cuello uterino. Cncer colorrectal. Cncer de piel. Cncer de pulmn. Qu debo saber sobre la enfermedad cardaca, la diabetes y la hipertensin arterial? Presin arterial y enfermedad cardaca La hipertensin arterial causa enfermedades cardacas y aumenta el riesgo de accidente cerebrovascular. Es ms probable que esto se manifieste en las personas que tienen lecturas de presin arterial alta o tienen sobrepeso. Hgase controlar la presin arterial: Cada 3 a 5 aos si tiene entre 18 y 22 aos. Todos los aos si es mayor de 40 aos. Diabetes Realcese exmenes de deteccin de la diabetes con regularidad. Este anlisis revisa el nivel de azcar en la sangre en [place]. Hgase las pruebas de deteccin: Cada tres aos despus de los 40 aos de edad si tiene un peso normal y un bajo riesgo de padecer diabetes. Con ms frecuencia y a partir de [place] edad inferior si tiene sobrepeso o un alto riesgo de padecer diabetes. Qu debo saber sobre la prevencin de infecciones? Hepatitis B Si tiene un riesgo ms alto de contraer hepatitis B, debe someterse a un examen de deteccin de este virus. Hable con el mdico para averiguar si tiene riesgo de contraer la infeccin por hepatitis B. Hepatitis C Se recomienda el anlisis a: [employer] 1945 y 1965. Todas las personas que tengan un riesgo de haber contrado hepatitis C. Enfermedades de transmisin sexual (ETS) Hgase las pruebas de airline pilot de ITS, incluidas la gonorrea y la clamidia, si: Es sexualmente activa y es adult nurse de 24  aos. Es mayor de 24 aos, y el mdico le informa que corre riesgo de tener este tipo de infecciones. La actividad sexual ha cambiado desde que le hicieron la ltima prueba de  deteccin y tiene un riesgo mayor de tener clamidia o copy. Pregntele al mdico si usted tiene riesgo. Pregntele al mdico si usted tiene un alto riesgo de primary school teacher VIH. El mdico tambin puede recomendarle un medicamento recetado para ayudar a evitar la infeccin por el VIH. Si elige tomar medicamentos para prevenir el VIH, primero debe oneok de deteccin del VIH. Luego debe hacerse anlisis cada 3 meses mientras est tomando los medicamentos. Embarazo Si est por dejar de menstruar (fase premenopusica) y usted puede quedar embarazada, busque asesoramiento antes de quedar embarazada. Tome de 400 a 800 microgramos (mcg) de cido flico todos los das si queda embarazada. Pida mtodos de control de la natalidad (anticonceptivos) si desea evitar un embarazo no deseado. Osteoporosis y menopausia La osteoporosis es una enfermedad en la que los huesos pierden los minerales y la fuerza por el avance de la edad. El resultado pueden ser fracturas en los [place]. Si tiene 65 aos o ms, o si est en riesgo de sufrir osteoporosis y fracturas, pregunte a su mdico si debe: Hacerse pruebas de deteccin de prdida sea. Tomar un suplemento de calcio o de vitamina D para reducir el riesgo de fracturas. Recibir terapia de reemplazo hormonal (TRH) para tratar los sntomas de la menopausia. Siga estas indicaciones en su casa: Consumo de alcohol No beba alcohol si: Su mdico le indica no hacerlo. Est embarazada, puede estar embarazada o est tratando de quedar embarazada. Si bebe alcohol: Limite la cantidad que bebe a lo siguiente: De 0 a 1 bebida por da. Sepa cunta cantidad de alcohol hay en las bebidas que toma. En los [address], una medida equivale a una botella de cerveza de 12 oz (355 ml), un vaso de vino de 5 oz (148 ml) o un vaso de una bebida alcohlica de alta graduacin de 1 oz (44 ml). Estilo de vida No consuma ningn producto que contenga nicotina o tabaco. Estos  productos incluyen cigarrillos, tabaco para theatre manager y aparatos de vapeo, como los cigarrillos electrnicos. Si necesita ayuda para dejar de consumir estos productos, consulte al mdico. No consuma drogas. No comparta agujas. Solicite ayuda a su mdico si necesita apoyo o informacin para abandonar las drogas. Indicaciones generales Realcese los estudios de rutina de [address], dentales y de wellsite geologist. Mantngase al da con las vacunas. Infrmele a su mdico si: Se siente deprimida con frecuencia. Alguna vez ha sido vctima de maltrato o no se siente seguro en su casa. Resumen Adoptar un estilo de vida saludable y recibir atencin preventiva son importantes para promover la salud y counsellor. Siga las instrucciones del mdico acerca de una dieta saludable, el ejercicio y la realizacin de pruebas o exmenes para hotel manager. Siga las instrucciones del mdico con respecto al control del colesterol y la presin arterial. Esta informacin no tiene theme park manager el consejo del mdico. Asegrese de hacerle al mdico cualquier pregunta que tenga. Document Revised: 10/30/2020 Document Reviewed: 10/30/2020 Elsevier Patient Education  2025 [employer].

## 2024-07-12 NOTE — Assessment & Plan Note (Signed)
Associated with multiple other symptoms. Differential diagnosis discussed with patient. Needs work-up. Stress/anxiety contributing factor. Blood work done today. We will follow-up after that.

## 2024-07-12 NOTE — Assessment & Plan Note (Signed)
 Clinically stable.  No recent flareups Uses albuterol  inhaler as needed

## 2024-07-13 LAB — MEASLES/MUMPS/RUBELLA IMMUNITY
Mumps IgG: 77.5 [AU]/ml
Rubella: 5.09 {index}
Rubeola IgG: 67.1 [AU]/ml

## 2024-07-13 LAB — VARICELLA ZOSTER ANTIBODY, IGG: Varicella IgG: 5.01 {s_co_ratio}

## 2024-07-25 ENCOUNTER — Ambulatory Visit: Admitting: Physical Medicine and Rehabilitation
